# Patient Record
Sex: Male | Born: 1950 | Race: Black or African American | Hispanic: No | Marital: Single | Smoking: Current every day smoker
Health system: Southern US, Community
[De-identification: ages and names within clinical notes are randomized; demographics above are authoritative.]

## PROBLEM LIST (undated history)

## (undated) DIAGNOSIS — Z720 Tobacco use: Secondary | ICD-10-CM

## (undated) DIAGNOSIS — Z789 Other specified health status: Secondary | ICD-10-CM

## (undated) DIAGNOSIS — K219 Gastro-esophageal reflux disease without esophagitis: Secondary | ICD-10-CM

## (undated) DIAGNOSIS — D649 Anemia, unspecified: Secondary | ICD-10-CM

## (undated) DIAGNOSIS — I428 Other cardiomyopathies: Secondary | ICD-10-CM

## (undated) DIAGNOSIS — J449 Chronic obstructive pulmonary disease, unspecified: Secondary | ICD-10-CM

## (undated) DIAGNOSIS — F1490 Cocaine use, unspecified, uncomplicated: Secondary | ICD-10-CM

## (undated) DIAGNOSIS — F1090 Alcohol use, unspecified, uncomplicated: Secondary | ICD-10-CM

## (undated) HISTORY — DX: Other specified health status: Z78.9

## (undated) HISTORY — DX: Tobacco use: Z72.0

## (undated) HISTORY — DX: Other cardiomyopathies: I42.8

## (undated) HISTORY — DX: Alcohol use, unspecified, uncomplicated: F10.90

## (undated) HISTORY — DX: Cocaine use, unspecified, uncomplicated: F14.90

## (undated) HISTORY — DX: Chronic obstructive pulmonary disease, unspecified: J44.9

## (undated) HISTORY — DX: Anemia, unspecified: D64.9

---

## 1985-11-29 HISTORY — PX: WRIST ARTHROSCOPY: SHX838

## 2011-11-30 HISTORY — PX: MANDIBLE FRACTURE SURGERY: SHX706

## 2017-08-01 ENCOUNTER — Emergency Department (HOSPITAL_COMMUNITY): Payer: Medicaid Other

## 2017-08-01 ENCOUNTER — Encounter (HOSPITAL_COMMUNITY): Payer: Self-pay

## 2017-08-01 ENCOUNTER — Emergency Department (HOSPITAL_COMMUNITY)
Admission: EM | Admit: 2017-08-01 | Discharge: 2017-08-01 | Disposition: A | Discharge status: Home / Self Care | Payer: Medicaid Other | Attending: Emergency Medicine | Admitting: Emergency Medicine

## 2017-08-01 DIAGNOSIS — Y939 Activity, unspecified: Secondary | ICD-10-CM | POA: Diagnosis not present

## 2017-08-01 DIAGNOSIS — S41102A Unspecified open wound of left upper arm, initial encounter: Secondary | ICD-10-CM

## 2017-08-01 DIAGNOSIS — F17200 Nicotine dependence, unspecified, uncomplicated: Secondary | ICD-10-CM | POA: Insufficient documentation

## 2017-08-01 DIAGNOSIS — Y999 Unspecified external cause status: Secondary | ICD-10-CM | POA: Diagnosis not present

## 2017-08-01 DIAGNOSIS — Z23 Encounter for immunization: Secondary | ICD-10-CM | POA: Insufficient documentation

## 2017-08-01 DIAGNOSIS — S51812A Laceration without foreign body of left forearm, initial encounter: Secondary | ICD-10-CM | POA: Diagnosis not present

## 2017-08-01 DIAGNOSIS — S61412A Laceration without foreign body of left hand, initial encounter: Secondary | ICD-10-CM | POA: Diagnosis not present

## 2017-08-01 DIAGNOSIS — Y929 Unspecified place or not applicable: Secondary | ICD-10-CM | POA: Insufficient documentation

## 2017-08-01 DIAGNOSIS — S59912A Unspecified injury of left forearm, initial encounter: Secondary | ICD-10-CM | POA: Diagnosis present

## 2017-08-01 DIAGNOSIS — S46902A Unspecified injury of unspecified muscle, fascia and tendon at shoulder and upper arm level, left arm, initial encounter: Secondary | ICD-10-CM

## 2017-08-01 LAB — CBC WITH DIFFERENTIAL/PLATELET
BASOS ABS: 0 10*3/uL (ref 0.0–0.1)
BASOS PCT: 1 %
EOS PCT: 5 %
Eosinophils Absolute: 0.2 10*3/uL (ref 0.0–0.7)
HCT: 36 % — ABNORMAL LOW (ref 39.0–52.0)
Hemoglobin: 12.3 g/dL — ABNORMAL LOW (ref 13.0–17.0)
Lymphocytes Relative: 42 %
Lymphs Abs: 1.7 10*3/uL (ref 0.7–4.0)
MCH: 33.5 pg (ref 26.0–34.0)
MCHC: 34.2 g/dL (ref 30.0–36.0)
MCV: 98.1 fL (ref 78.0–100.0)
MONO ABS: 0.4 10*3/uL (ref 0.1–1.0)
Monocytes Relative: 10 %
Neutro Abs: 1.7 10*3/uL (ref 1.7–7.7)
Neutrophils Relative %: 42 %
PLATELETS: 147 10*3/uL — AB (ref 150–400)
RBC: 3.67 MIL/uL — ABNORMAL LOW (ref 4.22–5.81)
RDW: 12.1 % (ref 11.5–15.5)
WBC: 4 10*3/uL (ref 4.0–10.5)

## 2017-08-01 LAB — BASIC METABOLIC PANEL
ANION GAP: 9 (ref 5–15)
BUN: 7 mg/dL (ref 6–20)
CALCIUM: 8.9 mg/dL (ref 8.9–10.3)
CO2: 18 mmol/L — ABNORMAL LOW (ref 22–32)
CREATININE: 1.12 mg/dL (ref 0.61–1.24)
Chloride: 111 mmol/L (ref 101–111)
GLUCOSE: 84 mg/dL (ref 65–99)
Potassium: 3.8 mmol/L (ref 3.5–5.1)
Sodium: 138 mmol/L (ref 135–145)

## 2017-08-01 MED ORDER — CEFAZOLIN SODIUM-DEXTROSE 1-4 GM/50ML-% IV SOLN
1.0000 g | Freq: Once | INTRAVENOUS | Status: AC
  Administered 2017-08-01: 1 g via INTRAVENOUS
  Filled 2017-08-01: qty 50

## 2017-08-01 MED ORDER — TETANUS-DIPHTH-ACELL PERTUSSIS 5-2.5-18.5 LF-MCG/0.5 IM SUSP
0.5000 mL | Freq: Once | INTRAMUSCULAR | Status: AC
  Administered 2017-08-01: 0.5 mL via INTRAMUSCULAR
  Filled 2017-08-01: qty 0.5

## 2017-08-01 MED ORDER — CEPHALEXIN 500 MG PO CAPS: 500.0000 mg | ORAL_CAPSULE | Freq: Four times a day (QID) | ORAL | 0 refills | Status: DC

## 2017-08-01 MED ORDER — LIDOCAINE-EPINEPHRINE (PF) 2 %-1:200000 IJ SOLN
20.0000 mL | Freq: Once | INTRAMUSCULAR | Status: AC
  Administered 2017-08-01: 20 mL
  Filled 2017-08-01: qty 20
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
## 2017-08-01 NOTE — ED Notes (Signed)
Dr. Rosalia Hammersay at bedside repairing lacs

## 2017-08-01 NOTE — Discharge Instructions (Signed)
Call Dr. Merrilee SeashoreKuzma's office tomorrow in the morning for follow-up as soon as possible.
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
## 2017-08-01 NOTE — ED Notes (Signed)
Ortho tech paged  

## 2017-08-01 NOTE — ED Notes (Signed)
Ortho tech at bedside evaluating pt.

## 2017-08-01 NOTE — Progress Notes (Signed)
Orthopedic Tech Progress Note Patient Details:  Xavier AmorCharles Martinez 1951-08-20 [id number]  Ortho Devices Type of Ortho Device: Short arm splint Ortho Device/Splint Location: applied short arm splint to pt left hand.  applied abd dressing and volar to top and bottom of wrist and forearm.   left arm Ortho Device/Splint Interventions: Application   Alvina ChouWilliams, Avianna Moynahan C 08/01/2017, 11:16 PM

## 2017-08-10 ENCOUNTER — Other Ambulatory Visit: Payer: Self-pay | Admitting: Orthopedic Surgery

## 2017-08-11 ENCOUNTER — Ambulatory Visit (HOSPITAL_BASED_OUTPATIENT_CLINIC_OR_DEPARTMENT_OTHER): Payer: Medicaid Other | Admitting: Anesthesiology

## 2017-08-11 ENCOUNTER — Ambulatory Visit (HOSPITAL_BASED_OUTPATIENT_CLINIC_OR_DEPARTMENT_OTHER)
Admission: RE | Admit: 2017-08-11 | Discharge: 2017-08-11 | Disposition: A | Discharge status: Home / Self Care | Payer: Medicaid Other | Source: Ambulatory Visit | Attending: Orthopedic Surgery | Admitting: Orthopedic Surgery

## 2017-08-11 ENCOUNTER — Encounter (HOSPITAL_BASED_OUTPATIENT_CLINIC_OR_DEPARTMENT_OTHER): Payer: Self-pay | Admitting: Anesthesiology

## 2017-08-11 ENCOUNTER — Encounter (HOSPITAL_BASED_OUTPATIENT_CLINIC_OR_DEPARTMENT_OTHER)
Admission: RE | Disposition: A | Discharge status: Home / Self Care | Payer: Self-pay | Source: Ambulatory Visit | Attending: Orthopedic Surgery

## 2017-08-11 DIAGNOSIS — F17200 Nicotine dependence, unspecified, uncomplicated: Secondary | ICD-10-CM | POA: Insufficient documentation

## 2017-08-11 DIAGNOSIS — S66321A Laceration of extensor muscle, fascia and tendon of left index finger at wrist and hand level, initial encounter: Secondary | ICD-10-CM | POA: Insufficient documentation

## 2017-08-11 DIAGNOSIS — S66325A Laceration of extensor muscle, fascia and tendon of left ring finger at wrist and hand level, initial encounter: Secondary | ICD-10-CM | POA: Diagnosis not present

## 2017-08-11 DIAGNOSIS — S56522A Laceration of other extensor muscle, fascia and tendon at forearm level, left arm, initial encounter: Secondary | ICD-10-CM | POA: Diagnosis not present

## 2017-08-11 DIAGNOSIS — X58XXXA Exposure to other specified factors, initial encounter: Secondary | ICD-10-CM | POA: Diagnosis not present

## 2017-08-11 HISTORY — DX: Gastro-esophageal reflux disease without esophagitis: K21.9

## 2017-08-11 HISTORY — PX: TENDON REPAIR: SHX5111

## 2017-08-11 HISTORY — PX: WOUND EXPLORATION: SHX6188

## 2017-08-11 SURGERY — TENDON REPAIR
Anesthesia: General | Site: Arm Lower | Laterality: Left

## 2017-08-11 MED ORDER — MIDAZOLAM HCL 2 MG/2ML IJ SOLN
1.0000 mg | INTRAMUSCULAR | Status: DC | PRN
  Administered 2017-08-11: 2 mg via INTRAVENOUS

## 2017-08-11 MED ORDER — FENTANYL CITRATE (PF) 100 MCG/2ML IJ SOLN
INTRAMUSCULAR | Status: AC
  Filled 2017-08-11: qty 2

## 2017-08-11 MED ORDER — ONDANSETRON HCL 4 MG/2ML IJ SOLN
INTRAMUSCULAR | Status: AC
  Filled 2017-08-11: qty 2

## 2017-08-11 MED ORDER — BUPIVACAINE-EPINEPHRINE (PF) 0.5% -1:200000 IJ SOLN
INTRAMUSCULAR | Status: DC | PRN
  Administered 2017-08-11: 30 mL via PERINEURAL

## 2017-08-11 MED ORDER — HYDROCODONE-ACETAMINOPHEN 5-325 MG PO TABS: ORAL_TABLET | ORAL | 0 refills | Status: DC

## 2017-08-11 MED ORDER — MIDAZOLAM HCL 2 MG/2ML IJ SOLN
INTRAMUSCULAR | Status: AC
  Filled 2017-08-11: qty 2

## 2017-08-11 MED ORDER — CHLORHEXIDINE GLUCONATE 4 % EX LIQD: 60.0000 mL | Freq: Once | CUTANEOUS | Status: DC

## 2017-08-11 MED ORDER — CEFAZOLIN SODIUM-DEXTROSE 2-4 GM/100ML-% IV SOLN
INTRAVENOUS | Status: AC
  Filled 2017-08-11: qty 100

## 2017-08-11 MED ORDER — LACTATED RINGERS IV SOLN
INTRAVENOUS | Status: DC
  Administered 2017-08-11: 13:00:00 via INTRAVENOUS

## 2017-08-11 MED ORDER — PROPOFOL 10 MG/ML IV BOLUS
INTRAVENOUS | Status: AC
  Filled 2017-08-11: qty 20

## 2017-08-11 MED ORDER — HYDROMORPHONE HCL 1 MG/ML IJ SOLN: 0.2500 mg | INTRAMUSCULAR | Status: DC | PRN

## 2017-08-11 MED ORDER — PROMETHAZINE HCL 25 MG/ML IJ SOLN: 6.2500 mg | INTRAMUSCULAR | Status: DC | PRN

## 2017-08-11 MED ORDER — CEFAZOLIN SODIUM-DEXTROSE 2-4 GM/100ML-% IV SOLN
2.0000 g | INTRAVENOUS | Status: AC
  Administered 2017-08-11: 2 g via INTRAVENOUS

## 2017-08-11 MED ORDER — DEXAMETHASONE SODIUM PHOSPHATE 10 MG/ML IJ SOLN
INTRAMUSCULAR | Status: DC | PRN
  Administered 2017-08-11: 10 mg via INTRAVENOUS

## 2017-08-11 MED ORDER — SCOPOLAMINE 1 MG/3DAYS TD PT72: 1.0000 | MEDICATED_PATCH | Freq: Once | TRANSDERMAL | Status: DC | PRN

## 2017-08-11 MED ORDER — ONDANSETRON HCL 4 MG/2ML IJ SOLN
INTRAMUSCULAR | Status: DC | PRN
  Administered 2017-08-11: 4 mg via INTRAVENOUS

## 2017-08-11 MED ORDER — FENTANYL CITRATE (PF) 100 MCG/2ML IJ SOLN
50.0000 ug | INTRAMUSCULAR | Status: AC | PRN
  Administered 2017-08-11 (×2): 25 ug via INTRAVENOUS
  Administered 2017-08-11: 100 ug via INTRAVENOUS

## 2017-08-11 MED ORDER — DEXAMETHASONE SODIUM PHOSPHATE 10 MG/ML IJ SOLN
INTRAMUSCULAR | Status: AC
  Filled 2017-08-11: qty 1

## 2017-08-11 SURGICAL SUPPLY — 48 items
BANDAGE ACE 3X5.8 VEL STRL LF (GAUZE/BANDAGES/DRESSINGS) ×3 IMPLANT
BLADE SURG 15 STRL LF DISP TIS (BLADE) ×2 IMPLANT
BLADE SURG 15 STRL SS (BLADE) ×4
BNDG ESMARK 4X9 LF (GAUZE/BANDAGES/DRESSINGS) ×3 IMPLANT
BNDG GAUZE ELAST 4 BULKY (GAUZE/BANDAGES/DRESSINGS) ×3 IMPLANT
CHLORAPREP W/TINT 26ML (MISCELLANEOUS) ×3 IMPLANT
CORD BIPOLAR FORCEPS 12FT (ELECTRODE) ×3 IMPLANT
COVER BACK TABLE 60X90IN (DRAPES) ×3 IMPLANT
COVER MAYO STAND STRL (DRAPES) ×3 IMPLANT
CUFF TOURNIQUET SINGLE 18IN (TOURNIQUET CUFF) ×3 IMPLANT
DECANTER SPIKE VIAL GLASS SM (MISCELLANEOUS) ×3 IMPLANT
DRAPE EXTREMITY T 121X128X90 (DRAPE) ×3 IMPLANT
DRAPE SURG 17X23 STRL (DRAPES) ×3 IMPLANT
GAUZE SPONGE 4X4 12PLY STRL (GAUZE/BANDAGES/DRESSINGS) ×3 IMPLANT
GAUZE XEROFORM 1X8 LF (GAUZE/BANDAGES/DRESSINGS) ×3 IMPLANT
GLOVE BIO SURGEON STRL SZ7.5 (GLOVE) ×3 IMPLANT
GLOVE BIOGEL PI IND STRL 7.0 (GLOVE) ×1 IMPLANT
GLOVE BIOGEL PI IND STRL 8 (GLOVE) ×1 IMPLANT
GLOVE BIOGEL PI IND STRL 8.5 (GLOVE) ×1 IMPLANT
GLOVE BIOGEL PI INDICATOR 7.0 (GLOVE) ×2
GLOVE BIOGEL PI INDICATOR 8 (GLOVE) ×2
GLOVE BIOGEL PI INDICATOR 8.5 (GLOVE) ×2
GLOVE SURG ORTHO 8.0 STRL STRW (GLOVE) ×3 IMPLANT
GLOVE SURG SS PI 7.0 STRL IVOR (GLOVE) ×3 IMPLANT
GOWN STRL REUS W/ TWL LRG LVL3 (GOWN DISPOSABLE) ×1 IMPLANT
GOWN STRL REUS W/TWL LRG LVL3 (GOWN DISPOSABLE) ×2
GOWN STRL REUS W/TWL XL LVL3 (GOWN DISPOSABLE) ×6 IMPLANT
NS IRRIG 1000ML POUR BTL (IV SOLUTION) ×3 IMPLANT
PACK BASIN DAY SURGERY FS (CUSTOM PROCEDURE TRAY) ×3 IMPLANT
PAD CAST 3X4 CTTN HI CHSV (CAST SUPPLIES) ×1 IMPLANT
PADDING CAST ABS 4INX4YD NS (CAST SUPPLIES) ×2
PADDING CAST ABS COTTON 4X4 ST (CAST SUPPLIES) ×1 IMPLANT
PADDING CAST COTTON 3X4 STRL (CAST SUPPLIES) ×2
SLEEVE SCD COMPRESS KNEE MED (MISCELLANEOUS) ×3 IMPLANT
SLING ARM FOAM STRAP LRG (SOFTGOODS) ×3 IMPLANT
SPEAR EYE SURG WECK-CEL (MISCELLANEOUS) ×3 IMPLANT
SPLINT PLASTER CAST XFAST 3X15 (CAST SUPPLIES) ×20 IMPLANT
SPLINT PLASTER XTRA FASTSET 3X (CAST SUPPLIES) ×40
STOCKINETTE 4X48 STRL (DRAPES) ×3 IMPLANT
SUT ETHILON 3 0 PS 1 (SUTURE) ×6 IMPLANT
SUT ETHILON 4 0 PS 2 18 (SUTURE) ×6 IMPLANT
SUT FIBERWIRE 3-0 18 TAPR NDL (SUTURE) ×3
SUT VIC AB 3-0 PS1 18 (SUTURE) ×2
SUT VIC AB 3-0 PS1 18XBRD (SUTURE) ×1 IMPLANT
SUTURE FIBERWR 3-0 18 TAPR NDL (SUTURE) ×1 IMPLANT
SYR BULB 3OZ (MISCELLANEOUS) ×3 IMPLANT
TOWEL OR 17X24 6PK STRL BLUE (TOWEL DISPOSABLE) ×6 IMPLANT
UNDERPAD 30X30 (UNDERPADS AND DIAPERS) ×3 IMPLANT

## 2017-08-11 NOTE — Anesthesia Preprocedure Evaluation (Addendum)
Anesthesia Evaluation  Patient identified by MRN, date of birth, ID band Patient awake    Reviewed: Allergy & Precautions, NPO status , Patient's Chart, lab work & pertinent test results  History of Anesthesia Complications Negative for: history of anesthetic complications  Airway Mallampati: I  TM Distance: >3 FB Neck ROM: Full    Dental  (+) Dental Advisory Given, Edentulous Upper, Edentulous Lower   Pulmonary Current Smoker,    Pulmonary exam normal        Cardiovascular negative cardio ROS Normal cardiovascular exam     Neuro/Psych negative neurological ROS     GI/Hepatic negative GI ROS, Neg liver ROS,   Endo/Other  negative endocrine ROS  Renal/GU negative Renal ROS     Musculoskeletal negative musculoskeletal ROS (+)   Abdominal   Peds  Hematology negative hematology ROS (+)   Anesthesia Other Findings Day of surgery medications reviewed with the patient.  Reproductive/Obstetrics                            Anesthesia Physical Anesthesia Plan  ASA: II  Anesthesia Plan: General   Post-op Pain Management:    Induction: Intravenous  PONV Risk Score and Plan: 2 and Ondansetron and Dexamethasone  Airway Management Planned: LMA  Additional Equipment:   Intra-op Plan:   Post-operative Plan: Extubation in OR  Informed Consent: I have reviewed the patients History and Physical, chart, labs and discussed the procedure including the risks, benefits and alternatives for the proposed anesthesia with the patient or authorized representative who has indicated his/her understanding and acceptance.   Dental advisory given  Plan Discussed with: CRNA, Anesthesiologist and Surgeon  Anesthesia Plan Comments:        Anesthesia Quick Evaluation

## 2017-08-11 NOTE — Discharge Instructions (Addendum)
[facility] Instructions Hand Surgery  Wound Care: Keep your hand elevated above the level of your heart.  Do not allow it to dangle by your side.  Keep the dressing dry and do not remove it unless your doctor advises you to do so.  He will usually change it at the time of your post-op visit.  Moving your fingers is advised to stimulate circulation but will depend on the site of your surgery.  If you have a splint applied, your doctor will advise you regarding movement.  Activity: Do not drive or operate machinery today.  Rest today and then you may return to your normal activity and work as indicated by your physician.  Diet:  Drink liquids today or eat a light diet.  You may resume a regular diet tomorrow.     Post Anesthesia Home Care Instructions  Activity: Get plenty of rest for the remainder of the day. A responsible individual must stay with you for 24 hours following the procedure.  For the next 24 hours, DO NOT: -Drive a car -Operate machinery -Drink alcoholic beverages -Take any medication unless instructed by your physician -Make any legal decisions or sign important papers.  Meals: Start with liquid foods such as gelatin or soup. Progress to regular foods as tolerated. Avoid greasy, spicy, heavy foods. If nausea and/or vomiting occur, drink only clear liquids until the nausea and/or vomiting subsides. Call your physician if vomiting continues.  Special Instructions/Symptoms: Your throat may feel dry or sore from the anesthesia or the breathing tube placed in your throat during surgery. If this causes discomfort, gargle with warm salt water. The discomfort should disappear within 24 hours.  If you had a scopolamine patch placed behind your ear for the management of post- operative nausea and/or vomiting:  1. The medication in the patch is effective for 72 hours, after which it should be removed.  Wrap patch in a tissue and discard in the trash. Wash hands thoroughly with  soap and water. 2. You may remove the patch earlier than 72 hours if you experience unpleasant side effects which may include dry mouth, dizziness or visual disturbances. 3. Avoid touching the patch. Wash your hands with soap and water after contact with the patch.    Regional Anesthesia Blocks  1. Numbness or the inability to move the "blocked" extremity may last from 3-48 hours after placement. The length of time depends on the medication injected and your individual response to the medication. If the numbness is not going away after 48 hours, call your surgeon.  2. The extremity that is blocked will need to be protected until the numbness is gone and the  Strength has returned. Because you cannot feel it, you will need to take extra care to avoid injury. Because it may be weak, you may have difficulty moving it or using it. You may not know what position it is in without looking at it while the block is in effect.  3. For blocks in the legs and feet, returning to weight bearing and walking needs to be done carefully. You will need to wait until the numbness is entirely gone and the strength has returned. You should be able to move your leg and foot normally before you try and bear weight or walk. You will need someone to be with you when you first try to ensure you do not fall and possibly risk injury.  4. Bruising and tenderness at the needle site are common side effects and   will resolve in a few days.  5. Persistent numbness or new problems with movement should be communicated to the surgeon or the [facility] ([phone])/ [facility] ([phone]).  General expectations: Pain for two to three days. Fingers may become slightly swollen.  Call your doctor if any of the following occur: Severe pain not relieved by pain medication. Elevated temperature. Dressing soaked with blood. Inability to move fingers. White or bluish color to fingers.  

## 2017-08-11 NOTE — Progress Notes (Signed)
Assisted Dr. Singer with left, ultrasound guided, supraclavicular block. Side rails up, monitors on throughout procedure. See vital signs in flow sheet. Tolerated Procedure well. 

## 2017-08-11 NOTE — Brief Op Note (Signed)
08/11/2017  2:21 PM  PATIENT:  Schuyler Amorharles Mcdevitt  66 y.o. male  PRE-OPERATIVE DIAGNOSIS:  LEFT ARM/HAND LACERATIONS WITH EXTENSOR TENDON LACERATIONS  POST-OPERATIVE DIAGNOSIS:  LEFT ARM/HAND LACERATIONS WITH EXTENSOR TENDON LACERATIONS  PROCEDURE:  Procedure(s): TENDON REPAIR WITH GRAFT AND TENDON TRANSFER (Left) LEFT ARM WOUND EXPLORATION (Left)  SURGEON:  Surgeon(s) and Role:    * Betha LoaKuzma, Reena Borromeo, MD - Primary    * Cindee SaltKuzma, Gary, MD - Assisting  PHYSICIAN ASSISTANT:   ASSISTANTS: Cindee SaltGary Mitsuo Budnick, MD   ANESTHESIA:   regional and general  EBL:  Total I/O In: 500 [I.V.:500] Out: -   BLOOD ADMINISTERED:none  DRAINS: none   LOCAL MEDICATIONS USED:  NONE  SPECIMEN:  No Specimen  DISPOSITION OF SPECIMEN:  N/A  COUNTS:  YES  TOURNIQUET:   Total Tourniquet Time Documented: Upper Arm (Left) - 54 minutes Total: Upper Arm (Left) - 54 minutes   DICTATION: .Other Dictation: Dictation Number [id number]  PLAN OF CARE: Discharge to home after PACU  PATIENT DISPOSITION:  PACU - hemodynamically stable.

## 2017-08-11 NOTE — Op Note (Signed)
I assisted Surgeon(s) and Role:    * Betha LoaKuzma, Kevin, MD - Primary    Cindee Salt* Nels Munn, MD - Assisting on the Procedure(s): TENDON REPAIR WITH GRAFT AND TENDON TRANSFER LEFT ARM WOUND EXPLORATION on 08/11/2017.  I provided assistance on this case as follows: setup, approach, debridement identification of the lacerations, harvesting of the graft, repairs of the tendons including graft placement, closure of the wounds and application of the dressings and splint. I was present for the entire case.  Electronically signed by: Nicki ReaperKUZMA,Nitika Jackowski R, MD Date: 08/11/2017 Time: 2:23 PM

## 2017-08-11 NOTE — Transfer of Care (Signed)
Immediate Anesthesia Transfer of Care Note  Patient: Xavier Martinez  Procedure(s) Performed: Procedure(s): TENDON REPAIR WITH GRAFT AND TENDON TRANSFER (Left) LEFT ARM WOUND EXPLORATION (Left)  Patient Location: PACU  Anesthesia Type:GA combined with regional for post-op pain  Level of Consciousness: sedated  Airway & Oxygen Therapy: Patient Spontanous Breathing and Patient connected to face mask oxygen  Post-op Assessment: Report given to RN and Post -op Vital signs reviewed and stable  Post vital signs: Reviewed and stable  Last Vitals:  Vitals:   08/11/17 1252 08/11/17 1255  BP:  107/60  Pulse: 61 63  Resp: 14 15  Temp:    SpO2: 100% 100%    Last Pain:  Vitals:   08/11/17 1234  TempSrc: Oral         Complications: No apparent anesthesia complications

## 2017-08-11 NOTE — Anesthesia Procedure Notes (Signed)
Anesthesia Regional Block: Supraclavicular block   Pre-Anesthetic Checklist: ,, timeout performed, Correct Patient, Correct Site, Correct Laterality, Correct Procedure, Correct Position, site marked, Risks and benefits discussed,  Surgical consent,  Pre-op evaluation,  At surgeon's request and post-op pain management  Laterality: Left  Prep: chloraprep       Needles:  Injection technique: Single-shot  Needle Type: Echogenic Stimulator Needle     Needle Length: 5cm  Needle Gauge: 22     Additional Needles:   Narrative:  Start time: 08/11/2017 12:47 PM End time: 08/11/2017 12:57 PM Injection made incrementally with aspirations every 5 mL.  Performed by: Personally  Anesthesiologist: Heather RobertsSINGER, Lenka Zhao  Additional Notes: Functioning IV was confirmed and monitors applied.  A 50mm 22ga echogenic arrow stimulator was used. Sterile prep and drape,hand hygiene and sterile gloves were used.Ultrasound guidance: relevant anatomy identified, needle position confirmed, local anesthetic spread visualized around nerve(s)., vascular puncture avoided.  Image printed for medical record.  Negative aspiration and negative test dose prior to incremental administration of local anesthetic. The patient tolerated the procedure well.

## 2017-08-11 NOTE — Op Note (Signed)
[id number] 

## 2017-08-11 NOTE — Op Note (Signed)
Xavier Martinez, Xavier Martinez NO.:  [id number]  MEDICAL RECORD NO.:  [id number]  LOCATION:                                 FACILITY:  PHYSICIAN:  Betha Loa, MD             DATE OF BIRTH:  DATE OF PROCEDURE:  08/11/2017 DATE OF DISCHARGE:                              OPERATIVE REPORT   PREOPERATIVE DIAGNOSIS:  Left hand and forearm lacerations with extensor tendon lacerations.  POSTOPERATIVE DIAGNOSIS:  Left hand and forearm lacerations with laceration of EIP and EDC to index finger and partial laceration of EDC to ring finger.  PROCEDURE:   1. Left forearm and hand exploration of wounds 2. Repair hand intermediate laceration ~3cm 3. Repair forearm intermediate laceration ~4cm  4. Transfer of EDC to ring finger to the adjacent long finger EDC 5. Transfer of the EDC of index finger to long finger EDC 6. Repair of EIP with free tendon graft from EDC of index finger.  SURGEON:  Betha Loa, MD.  ASSISTANT:  None.  ANESTHESIA:  General with regional.  IV FLUIDS:  Per anesthesia flow sheet.  ESTIMATED BLOOD LOSS:  Minimal.  COMPLICATIONS:  None.  SPECIMENS:  None.  TOURNIQUET TIME:  54 minutes.  DISPOSITION:  Stable to PACU.  INDICATIONS:  Xavier Martinez is a 66 year old male who 10 days ago was involved in an incident in which he sustained lacerations to the left forearm and hand.  He was seen in the emergency department where his wounds were cleansed and closed.  He followed up in the office.  He had inability to extend the index finger.  I recommended exploration of wounds with repair of tendon, artery, and nerve as necessary.  Risks, benefits, and alternatives of surgery were discussed including the risk of blood loss, infection, damage to nerves, vessels, tendons, ligaments, bone, failure of surgery, need for additional surgery, complications with wound healing, continued pain, continued loss of range of motion, and possible need for tendon transfer or  tendon grafting due to tendon retraction.  He voiced understanding of these risks and elected to proceed.  OPERATIVE COURSE:  After being identified preoperatively by myself, the patient and I agreed upon the procedure and site of procedure.  Surgical site was marked.  The risks, benefits, and alternatives of surgery were reviewed and he wished to proceed.  Surgical consent had been signed. He was given IV Ancef as preoperative antibiotic prophylaxis.  He was transferred to the operating room and placed on the operating room table in supine position with the left upper extremity on an arm board. General anesthesia was induced by anesthesiologist.  A regional block had been performed by Anesthesia in preoperative holding.  Left upper extremity was prepped and draped in a normal sterile orthopedic fashion. Surgical pause was performed between surgeons, Anesthesia, and operating room staff and all were in agreement as to the patient, procedure, and site of procedure.  Tourniquet at the proximal aspect of the extremity was inflated to 250 mmHg after exsanguination of the limb with an Esmarch bandage.  The proximal wound on the forearm was explored first. All sutures were removed.  There was  laceration through the fascia, but not into the muscle belly.  The wound on the dorsal ulnar aspect of the hand was opened and extended.  The EDC to the long, ring, and small fingers were visualized and were intact.  The EDC and EIP to the index finger were able to be visualized and were outside the zone of injury. They were intact in the dorsum of the hand.  The wound on the central portion of the distal forearm dorsally was extended both proximally and distally.  The EDC to the long and small fingers were intact.  There was partial laceration to the EDC to the ring finger.  There was complete laceration of the EDC to the index finger and EIP tendon.  The lacerated ends were not able to be brought  together without significant tension. It was felt that a primary repair was not viable option.  The wounds were all copiously irrigated with sterile saline.  They were debrided of hematoma from the tissues deep to the fascia using the pickups and Ray- Tec sponge.  The proximal wound and the wound on the dorsum of the hand were closed with nylon in a horizontal mattress fashion.  The EDC to the ring finger was sutured to the long finger EDC adjacent to it. There was still approximately 25% of this tendon intact.  The EDC to the index finger was [facility] to the long finger distally. The remaining stump of the EDC proximally was then harvested and used as a free tendon graft for the EIP tendon.  This was repaired in a Pulvertaft weave style.  All tendon repairs were performed with 3-0 FiberWire suture.  Once the tendon repairs had been performed, the wrist was placed through tenodesis and there was good cascade of the digits. The dorsal wound was then closed with a combination of 3 and 4-0 nylon suture in a horizontal mattress fashion.  The wounds were dressed with sterile Xeroform, 4 x 4's and wrapped with a Kerlix bandage.  A volar slab splint was placed with the wrist extended 30-40 degrees and the digits extended as well.  This was wrapped with Kerlix and Ace bandage. Tourniquet was deflated at 54 minutes.  Fingertips were pink with brisk capillary refill after deflation of tourniquet.  The operative drapes were broken down, and the patient was awakened from anesthesia safely. He was transferred back to stretcher and taken to PACU in stable condition.  I will see him back in the office in 1 week for postoperative followup.  I will give him Norco 5/325 one to two p.o. q.6 hours p.r.n. pain, dispensed #30.     Betha LoaKevin Blaike Vickers, MD     KK/MEDQ  D:  08/11/2017  T:  08/11/2017  Job:  [id number]

## 2017-08-11 NOTE — Anesthesia Postprocedure Evaluation (Signed)
Anesthesia Post Note  Patient: Xavier Martinez  Procedure(s) Performed: Procedure(s) (LRB): TENDON REPAIR WITH GRAFT AND TENDON TRANSFER (Left) LEFT ARM WOUND EXPLORATION (Left)     Patient location during evaluation: PACU Anesthesia Type: General Level of consciousness: sedated Pain management: pain level controlled Vital Signs Assessment: post-procedure vital signs reviewed and stable Respiratory status: spontaneous breathing and respiratory function stable Cardiovascular status: stable Postop Assessment: no apparent nausea or vomiting Anesthetic complications: no    Last Vitals:  Vitals:   08/11/17 1445 08/11/17 1500  BP: 133/83 133/90  Pulse: 72 68  Resp: 13 16  Temp:    SpO2: 100% 98%    Last Pain:  Vitals:   08/11/17 1500  TempSrc:   PainSc: 0-No pain                 Leanza Shepperson DANIEL

## 2017-08-11 NOTE — Anesthesia Procedure Notes (Signed)
Procedure Name: LMA Insertion Date/Time: 08/11/2017 1:10 PM Performed by: Burna CashONRAD, Zhanae Proffit C Pre-anesthesia Checklist: Patient identified, Emergency Drugs available, Suction available and Patient being monitored Patient Re-evaluated:Patient Re-evaluated prior to induction Oxygen Delivery Method: Circle system utilized Preoxygenation: Pre-oxygenation with 100% oxygen Induction Type: IV induction Ventilation: Mask ventilation without difficulty LMA: LMA inserted LMA Size: 5.0 Number of attempts: 1 Airway Equipment and Method: Bite block Placement Confirmation: positive ETCO2 Tube secured with: Tape Dental Injury: Teeth and Oropharynx as per pre-operative assessment

## 2017-08-11 NOTE — H&P (Signed)
  Xavier Martinez is an 66 y.o. male.   Chief Complaint: left forearm/hand lacerations HPI: 66 yo male states he sustained lacerations to left hand/forearm 10 days ago.  Seen in ED where wounds cleaned and sutured.  Unable to extend left index finger.  He wishes to undergo operative exploration with repair of tendon as necessary.  Allergies: No Known Allergies  No past medical history on file.  No past surgical history on file.  Family History: No family history on file.  Social History:   reports that he has been smoking.  He has never used smokeless tobacco. He reports that he drinks alcohol. He reports that he does not use drugs.  Medications: No prescriptions prior to admission.    No results found for this or any previous visit (from the past 48 hour(s)).  No results found.   A comprehensive review of systems was negative.  There were no vitals taken for this visit.  General appearance: alert, cooperative and appears stated age Head: Normocephalic, without obvious abnormality, atraumatic Neck: supple, symmetrical, trachea midline Resp: clear to auscultation bilaterally Cardio: regular rate and rhythm GI: non-tender Extremities: Intact sensation and capillary refill all digits.  +epl/fpl/io.  Lacerations to dorsum of hand and forearm.  Unable to extend index finger. Pulses: 2+ and symmetric Skin: Skin color, texture, turgor normal. No rashes or lesions Neurologic: Grossly normal Incision/Wound: as above  Assessment/Plan Left forearm/hand lacerations.  Recommend exploration with repair of tendon/artery/nerve as necessary.  Discussed possible transfer of tendon to adjacent tendon or grafting if tendon has retracted too much for primary repair.  Risks, benefits, and alternatives of surgery were discussed and the patient agrees with the plan of care.   [name] R 08/11/2017, 8:42 AM

## 2017-08-12 ENCOUNTER — Encounter (HOSPITAL_BASED_OUTPATIENT_CLINIC_OR_DEPARTMENT_OTHER): Payer: Self-pay | Admitting: Orthopedic Surgery

## 2019-04-19 ENCOUNTER — Emergency Department (HOSPITAL_COMMUNITY): Payer: Medicare HMO

## 2019-04-19 ENCOUNTER — Encounter (HOSPITAL_COMMUNITY): Payer: Self-pay | Admitting: Emergency Medicine

## 2019-04-19 ENCOUNTER — Emergency Department (HOSPITAL_COMMUNITY)
Admission: EM | Admit: 2019-04-19 | Discharge: 2019-04-19 | Disposition: A | Discharge status: Home / Self Care | Payer: Medicare HMO | Attending: Emergency Medicine | Admitting: Emergency Medicine

## 2019-04-19 ENCOUNTER — Other Ambulatory Visit: Payer: Self-pay

## 2019-04-19 DIAGNOSIS — Y9383 Activity, rough housing and horseplay: Secondary | ICD-10-CM | POA: Insufficient documentation

## 2019-04-19 DIAGNOSIS — Y929 Unspecified place or not applicable: Secondary | ICD-10-CM | POA: Diagnosis not present

## 2019-04-19 DIAGNOSIS — W010XXA Fall on same level from slipping, tripping and stumbling without subsequent striking against object, initial encounter: Secondary | ICD-10-CM | POA: Insufficient documentation

## 2019-04-19 DIAGNOSIS — S6991XA Unspecified injury of right wrist, hand and finger(s), initial encounter: Secondary | ICD-10-CM | POA: Insufficient documentation

## 2019-04-19 DIAGNOSIS — S4991XA Unspecified injury of right shoulder and upper arm, initial encounter: Secondary | ICD-10-CM

## 2019-04-19 DIAGNOSIS — Y999 Unspecified external cause status: Secondary | ICD-10-CM | POA: Insufficient documentation

## 2019-04-19 DIAGNOSIS — F17210 Nicotine dependence, cigarettes, uncomplicated: Secondary | ICD-10-CM | POA: Diagnosis not present

## 2019-04-19 NOTE — Discharge Instructions (Signed)
You were seen in the emergency department with right wrist pain after fall.  I have applied a splint to help with pain symptoms.  Apply ice for the remainder of the morning and keep the extremity elevated.  You may take Tylenol for pain.  Call your PCP and the orthopedic doctor listed as you may require additional x-ray in the next 1 to 2 weeks.  Return to the emergency department with any fevers, worsening pain, other severe symptoms.

## 2019-04-19 NOTE — ED Provider Notes (Signed)
Emergency Department Provider Note   I have reviewed the triage vital signs and the nursing notes.   HISTORY  Chief Complaint Arm Injury   HPI Xavier Martinez is a 68 y.o. male presents to the emergency department for evaluation of right wrist pain.  Patient is right-hand dominant.  He states he was wrestling with his nephew yesterday when he fell backwards onto his outstretched hand.  He has had pain and some swelling in the wrist since yesterday.  He initially had some tingling in the fingers but this is resolved.  Denies pain in the elbow or shoulder.  He did not hit his head.  He has not had fevers, chills, warmth over the joint.  Pain is worse with movement.   Past Medical History:  Diagnosis Date  . GERD (gastroesophageal reflux disease)     There are no active problems to display for this patient.   Past Surgical History:  Procedure Laterality Date  . MANDIBLE FRACTURE SURGERY Bilateral 2013  . TENDON REPAIR Left 08/11/2017   Procedure: TENDON REPAIR WITH GRAFT AND TENDON TRANSFER;  Surgeon: Betha LoaKuzma, Kevin, MD;  Location: [facility];  Service: Orthopedics;  Laterality: Left;  . WOUND EXPLORATION Left 08/11/2017   Procedure: LEFT ARM WOUND EXPLORATION;  Surgeon: Betha LoaKuzma, Kevin, MD;  Location: [facility];  Service: Orthopedics;  Laterality: Left;  . WRIST ARTHROSCOPY Right 1987    Allergies Patient has no known allergies.  No family history on file.  Social History Social History   Tobacco Use  . Smoking status: Current Every Day Smoker    Packs/day: 0.50    Years: 25.00    Pack years: 12.50    Types: Cigarettes  . Smokeless tobacco: Never Used  Substance Use Topics  . Alcohol use: Yes  . Drug use: No    Review of Systems  Constitutional: No fever/chills Eyes: No visual changes. ENT: No sore throat. Cardiovascular: Denies chest pain. Respiratory: Denies shortness of breath. Gastrointestinal: No abdominal pain.  No nausea, no  vomiting.  No diarrhea.  No constipation. Genitourinary: Negative for dysuria. Musculoskeletal: Negative for back pain. Positive right wrist pain.  Skin: Negative for rash. Neurological: Negative for headaches, focal weakness or numbness.  10-point ROS otherwise negative.  ____________________________________________   PHYSICAL EXAM:  VITAL SIGNS: ED Triage Vitals  Enc Vitals Group     BP 04/19/19 0854 123/74     Pulse Rate 04/19/19 0854 89     Resp 04/19/19 0854 16     Temp 04/19/19 0854 98.5 F (36.9 C)     Temp Source 04/19/19 0854 Oral     SpO2 04/19/19 0855 98 %     Weight 04/19/19 0852 135 lb (61.2 kg)     Height 04/19/19 0852 5\' 9"  (1.753 m)     Pain Score 04/19/19 0852 8   Constitutional: Alert and oriented. Well appearing and in no acute distress. Eyes: Conjunctivae are normal. Head: Atraumatic. Nose: No congestion/rhinnorhea. Mouth/Throat: Mucous membranes are moist.  Neck: No stridor.   Cardiovascular: Normal rate, regular rhythm.    Respiratory: Normal respiratory effort.   Gastrointestinal: No distention.  Musculoskeletal: Swelling without erythema or warmth over the right wrist.  No cellulitis.  Limited range of motion.  No specific scaphoid tenderness.  Normal range of motion of the right shoulder, elbow, fingers.  Good capillary refill.  Neurologic:  Normal speech and language. No gross focal neurologic deficits are appreciated.  Skin:  Skin is warm, dry and  intact. No rash noted.  ____________________________________________  RADIOLOGY  Dg Wrist Complete Right  Result Date: 04/19/2019 CLINICAL DATA:  Right wrist pain after fall yesterday. EXAM: RIGHT WRIST - COMPLETE 3+ VIEW COMPARISON:  None. FINDINGS: There is no evidence of fracture or dislocation. Mild narrowing of radiocarpal joint is noted. Vascular calcifications are noted. 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 No acute abnormality is noted. Electronically  Signed   By: Lupita Raider M.D.   On: 04/19/2019 09:27    ____________________________________________   PROCEDURES  Procedure(s) performed:   Procedures  None  ____________________________________________   INITIAL IMPRESSION / ASSESSMENT AND PLAN / ED COURSE  Pertinent labs & imaging results that were available during my care of the patient were reviewed by me and considered in my medical decision making (see chart for details).   Patient presents to the emergency department for evaluation of right wrist pain after fall yesterday.  Plain film reviewed which shows degenerative disease but no acute fracture dislocation.  Plan for Velcro wrist splint and repeat x-ray in the next 2 weeks.  Provided contact information for the patient's PCP as well as local orthopedic follow-up.   ____________________________________________  FINAL CLINICAL IMPRESSION(S) / ED DIAGNOSES  Final diagnoses:  Injury of right upper extremity, initial encounter    Note:  This document was prepared using Dragon voice recognition software and may include unintentional dictation errors.  Alona Bene, MD Emergency Medicine    Glorianne Proctor, Arlyss Repress, MD 04/19/19 [phone]

## 2019-04-19 NOTE — ED Triage Notes (Signed)
Pt states that he was playing around with his nephew yesterday and hurt his right wrist and forearm

## 2020-02-14 ENCOUNTER — Ambulatory Visit: Payer: Medicare HMO

## 2020-02-15 ENCOUNTER — Ambulatory Visit: Payer: Medicare HMO

## 2020-03-23 IMAGING — CR RIGHT WRIST - COMPLETE 3+ VIEW
4 series · 4 of 4 positions shown · non-contrast
Comparison: None.

CLINICAL DATA: Right wrist pain after fall yesterday.

EXAM:
RIGHT WRIST - COMPLETE 3+ VIEW

[pa]
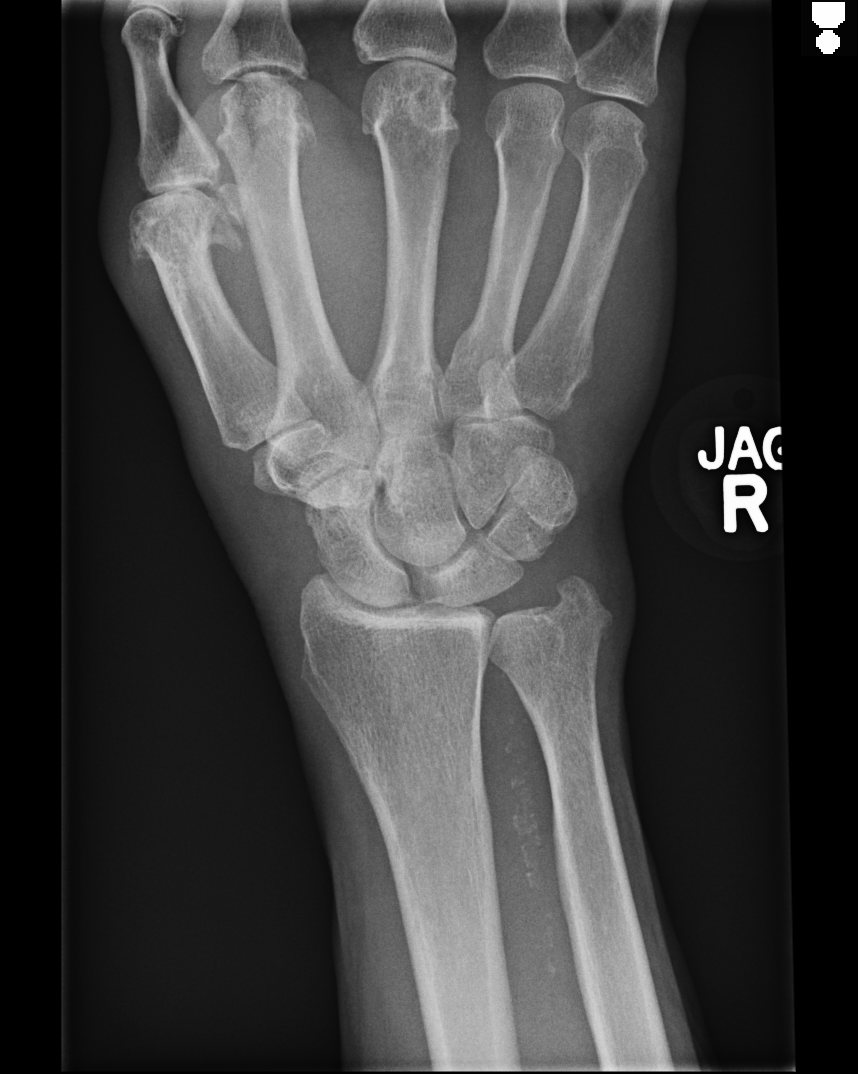

[oblique]
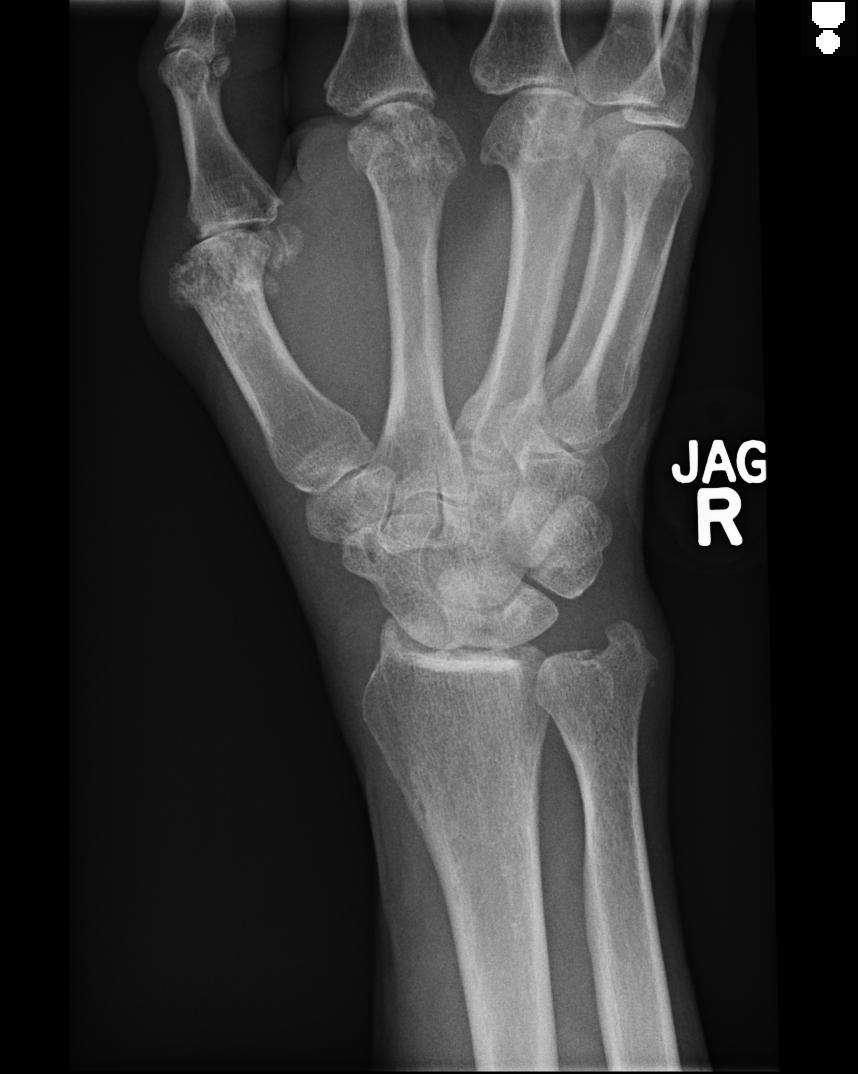

[lat]
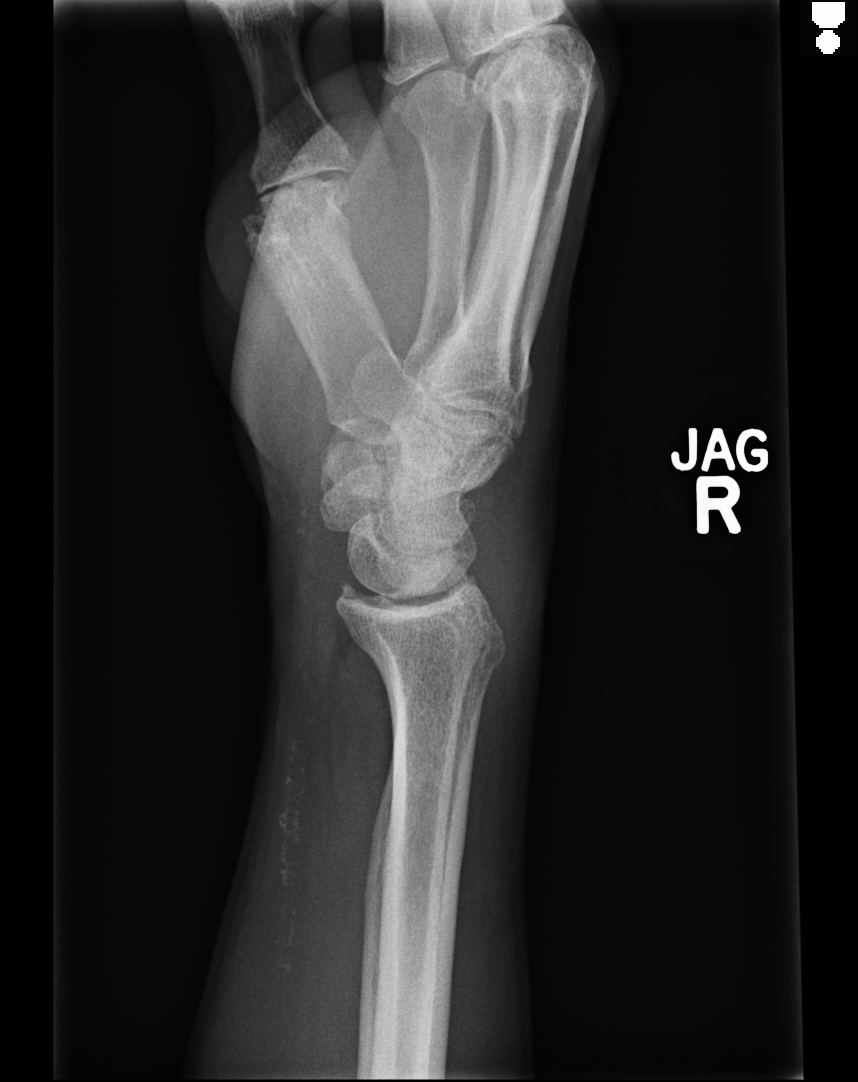

[navicul b.]
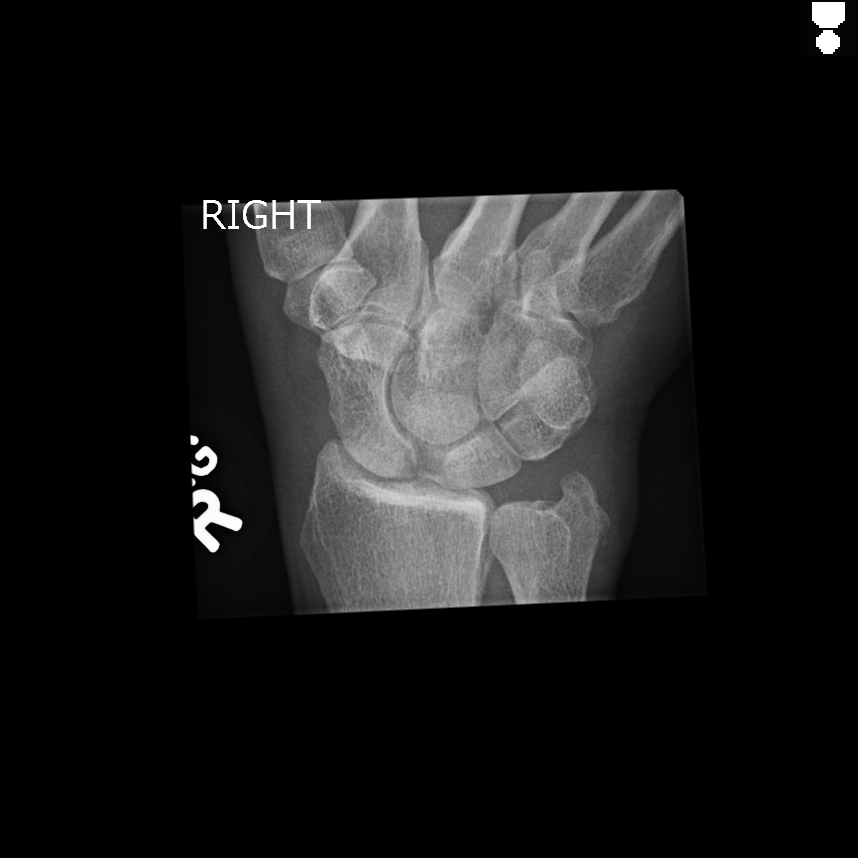

[4 of 4 positions shown; findings below may reference images not displayed]

FINDINGS: There is no evidence of fracture or dislocation. Mild narrowing of
radiocarpal joint is noted. Vascular calcifications are noted.
IMPRESSION: Mild degenerative joint disease of radiocarpal joint is noted. No
acute abnormality is noted.

## 2022-07-17 ENCOUNTER — Inpatient Hospital Stay: Admit: 2022-07-17 | Payer: Medicare HMO | Admitting: Internal Medicine

## 2022-07-17 ENCOUNTER — Encounter (HOSPITAL_COMMUNITY): Payer: Self-pay

## 2022-10-26 ENCOUNTER — Encounter: Payer: Self-pay | Admitting: Cardiology

## 2022-10-26 NOTE — Progress Notes (Signed)
Cardiology Office Note  Date: 10/27/2022   ID: Mindy Verzosa, DOB 1951-02-09, MRN [id number]  PCP:  Patient, No Pcp Per  Cardiologist:  Rozann Lesches, MD Electrophysiologist:  None   Chief Complaint  Patient presents with   Establish cardiology follow-up    History of Present Illness: Xavier Martinez is a 71 y.o. male referred for cardiology consultation by Ms. Rexene Agent with [facility] to establish follow-up of nonischemic cardiomyopathy.  I reviewed the available records.  He has a history of nonischemic cardiomyopathy based on testing through [facility] over the last year, most recent echocardiogram and Lexiscan Myoview are detailed below.  History also complicated by polysubstance abuse including cocaine, also GI bleeding with severe symptomatic anemia.  At this point he does not have a PCP, apparently was scheduled to see Dr. Huel Cote in [place], but missed his visit when he was in the hospital and has not seen anyone yet.  My understanding is that Dr. Huel Cote is leaving that practice, so I have asked him to call and see if he can get set up with another provider.  He describes NYHA class II dyspnea at this point, no angina or palpitations, no syncope.  States that he had a flu shot a few weeks ago and did have a "cold" after that.  Also reports trouble with COPD, has an albuterol inhaler.  In the past he had followed at [facility], but has not been seen in at least a few years.  He brought his medicines in today, states that he is taking them regularly.  Current cardiac regimen includes Coreg, Cozaar, and Lasix.  He is also on aspirin and Lipitor.  No recent lab work for review.  I personally reviewed his ECG which shows sinus rhythm with increased voltage and nonspecific T wave changes.  I asked him directly about cocaine use today, he states that he is still using it intermittently but is trying to stop.  This has been a long standing process for him.  He is also smoking cigarettes.  I  talked with him frankly about the critical importance of substance abuse cessation.  He does not report any obvious change in stools, no other bleeding.  Past Medical History:  Diagnosis Date   Alcohol use    Anemia    Cocaine use    COPD (chronic obstructive pulmonary disease) (HCC)    GERD (gastroesophageal reflux disease)    Nonischemic cardiomyopathy ([place])    Tobacco use     Past Surgical History:  Procedure Laterality Date   MANDIBLE FRACTURE SURGERY Bilateral 2013   TENDON REPAIR Left 08/11/2017   Procedure: TENDON REPAIR WITH GRAFT AND TENDON TRANSFER;  Surgeon: Leanora Cover, MD;  Location: [place];  Service: Orthopedics;  Laterality: Left;   WOUND EXPLORATION Left 08/11/2017   Procedure: LEFT ARM WOUND EXPLORATION;  Surgeon: Leanora Cover, MD;  Location: [place];  Service: Orthopedics;  Laterality: Left;   WRIST ARTHROSCOPY Right 1987    Current Outpatient Medications  Medication Sig Dispense Refill   albuterol (VENTOLIN HFA) 108 (90 Base) MCG/ACT inhaler Inhale 2 puffs into the lungs every 6 (six) hours as needed for wheezing.     aspirin EC 81 MG tablet Take 81 mg by mouth daily.     atorvastatin (LIPITOR) 80 MG tablet Take 80 mg by mouth daily.     carvedilol (COREG) 3.125 MG tablet Take 3.125 mg by mouth 2 (two) times daily with a meal.  ferrous sulfate 325 (65 FE) MG tablet Take 325 mg by mouth daily with breakfast.     furosemide (LASIX) 20 MG tablet Take 20 mg by mouth daily.     HYDROcodone-acetaminophen (NORCO) 5-325 MG tablet 1-2 tabs po q6 hours prn pain 30 tablet 0   nitroGLYCERIN (NITROSTAT) 0.4 MG SL tablet Place 0.4 mg under the tongue every 5 (five) minutes as needed for chest pain.     pantoprazole (PROTONIX) 40 MG tablet Take 40 mg by mouth daily.     losartan (COZAAR) 25 MG tablet Take 1 tablet (25 mg total) by mouth daily. 90 tablet 1   No current facility-administered medications for this visit.   Allergies:   Patient has no known allergies.   Social History: The patient  reports that he has been smoking cigarettes. He has a 12.50 pack-year smoking history. He has never used smokeless tobacco. He reports current alcohol use of about 10.0 standard drinks of alcohol per week. He reports current drug use. Drug: "Crack" cocaine.   Family History: The patient's family history includes Heart disease in his mother.   ROS: No syncope.  No leg swelling, no orthopnea or PND.  Physical Exam: VS:  BP 122/84   Pulse 75   Ht 5\' 9"  (1.753 m)   Wt 126 lb 6.4 oz (57.3 kg)   SpO2 95%   BMI 18.67 kg/m , BMI Body mass index is 18.67 kg/m.  Wt Readings from Last 3 Encounters:  10/27/22 126 lb 6.4 oz (57.3 kg)  04/19/19 135 lb (61.2 kg)  08/11/17 132 lb (59.9 kg)    General: Patient appears comfortable at rest. HEENT: Conjunctiva and lids normal. Neck: Supple, no elevated JVP or carotid bruits. Lungs: Clear to auscultation, nonlabored breathing at rest. Cardiac: Regular rate and rhythm, no S3 or significant systolic murmur. Abdomen: Soft, nontender, bowel sounds present. Extremities: No pitting edema, distal pulses 2+. Skin: Warm and dry. Musculoskeletal: No kyphosis. Neuropsychiatric: Alert and oriented x3, affect grossly appropriate.  ECG:   No old tracings available for direct review today.  Recent Labwork:  August 2023: Potassium 4.4, BUN 14, creatinine 1.23, hemoglobin 8.4, platelets 211, UDS positive for cocaine, NT-proBNP 2905, troponin T 154 and 141  Other Studies Reviewed Today:  Lexiscan Myoview 11/12/2021 ([facility]): 1. No evidence of inducible ischemia.  2. LVEF of 41% with global hypokinesis of the left ventricle.  3. The left ventricle is dilated.   Echocardiogram 07/19/2022 ([facility]): Left Ventricle: EF: 35-40%. Ejection fraction measured by 3D is 36%,    Left Atrium: Left atrium is moderately dilated at 4.700 cm.    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    Mitral Valve:  There is moderate to severe regurgitation.    Tricuspid Valve: There is mild to moderate regurgitation.   Assessment and Plan:  1.  HFrEF with nonischemic cardiomyopathy based on work-up through [facility].  LVEF 35 to 40% as of August of this year.  This is complicated by recurring cocaine abuse, I had a very frank discussion with him about the absolutely critical importance of cessation of all substance abuse.  He states that he is taking his medications regularly including Coreg, Cozaar, and Lasix.  Check BMET and follow-up echocardiogram.  Refill provided for Cozaar 25 mg daily at this point.  Office follow-up arranged.  Can address GDMT as tolerated going forward.  2.  History of GI bleed with severe symptomatic anemia based on chart review.  He does not report any stool  changes or obvious bleeding at this time.  His last hemoglobin was up to 8.4 during hospitalization through [facility] earlier in the year.  I have asked him to establish with PCP as this will clearly need follow-up over time.  2.  Health maintenance, I have asked him to reach out to Dr. Verna Czech practice to make sure that he has another provider scheduled for follow-up.  Also asked him to check with the [facility] which he had been following with in the past.  Medication Adjustments/Labs and Tests Ordered: Current medicines are reviewed at length with the patient today.  Concerns regarding medicines are outlined above.   Tests Ordered: Orders Placed This Encounter  Procedures   Basic metabolic panel   EKG 12-Lead   ECHOCARDIOGRAM COMPLETE    Medication Changes: Meds ordered this encounter  Medications   losartan (COZAAR) 25 MG tablet    Sig: Take 1 tablet (25 mg total) by mouth daily.    Dispense:  90 tablet    Refill:  1    Disposition:  Follow up  6 months in the [place] office.  Signed, Jonelle Sidle, MD, [facility] 10/27/2022 1:47 PM    [facility] [address]  [place], [place] [address] Phone: [phone]; Fax: [phone]

## 2022-10-27 ENCOUNTER — Encounter: Payer: Self-pay | Admitting: Cardiology

## 2022-10-27 ENCOUNTER — Ambulatory Visit: Payer: Medicare Other | Attending: Cardiology | Admitting: Cardiology

## 2022-10-27 VITALS — BP 122/84 | HR 75 | Ht 69.0 in | Wt 126.4 lb

## 2022-10-27 DIAGNOSIS — F1910 Other psychoactive substance abuse, uncomplicated: Secondary | ICD-10-CM

## 2022-10-27 DIAGNOSIS — I5020 Unspecified systolic (congestive) heart failure: Secondary | ICD-10-CM

## 2022-10-27 DIAGNOSIS — Z8719 Personal history of other diseases of the digestive system: Secondary | ICD-10-CM

## 2022-10-27 MED ORDER — LOSARTAN POTASSIUM 25 MG PO TABS: 25.0000 mg | ORAL_TABLET | Freq: Every day | ORAL | 1 refills | Status: DC

## 2022-10-27 NOTE — Patient Instructions (Addendum)
Medication Instructions:   Your physician recommends that you continue on your current medications as directed. Please refer to the Current Medication list given to you today.   Labwork:  BMET today   Testing/Procedures: Your physician has requested that you have an echocardiogram. Echocardiography is a painless test that uses sound waves to create images of your heart. It provides your doctor with information about the size and shape of your heart and how well your heart's chambers and valves are working. This procedure takes approximately one hour. There are no restrictions for this procedure. Please do NOT wear cologne, perfume, aftershave, or lotions (deodorant is allowed). Please arrive 15 minutes prior to your appointment time.   Follow-Up: 6 months in the [facility]  Any Other Special Instructions Will Be Listed Below (If Applicable).    [facility]: [address], [place], [place] [phone]   If you need a refill on your cardiac medications before your next appointment, please call your pharmacy.

## 2022-11-08 ENCOUNTER — Ambulatory Visit: Payer: Medicare Other | Attending: Cardiology

## 2022-11-08 DIAGNOSIS — I5020 Unspecified systolic (congestive) heart failure: Secondary | ICD-10-CM

## 2022-11-08 LAB — ECHOCARDIOGRAM COMPLETE
AR max vel: 2.41 cm2
AV Peak grad: 4.8 mmHg
Ao pk vel: 1.09 m/s
Area-P 1/2: 2.3 cm2
Calc EF: 55.9 %
MV M vel: 5.87 m/s
MV Peak grad: 137.8 mmHg
Radius: 0.38 cm
S' Lateral: 3.3 cm
Single Plane A2C EF: 55.5 %
Single Plane A4C EF: 55.1 %

## 2022-11-09 ENCOUNTER — Other Ambulatory Visit: Payer: Self-pay

## 2022-11-09 DIAGNOSIS — I340 Nonrheumatic mitral (valve) insufficiency: Secondary | ICD-10-CM

## 2022-11-16 ENCOUNTER — Telehealth: Payer: Self-pay | Admitting: Cardiology

## 2022-11-16 MED ORDER — PANTOPRAZOLE SODIUM 40 MG PO TBEC: 40.0000 mg | DELAYED_RELEASE_TABLET | Freq: Every day | ORAL | 3 refills | Status: DC

## 2022-11-16 MED ORDER — ASPIRIN 81 MG PO TBEC: 81.0000 mg | DELAYED_RELEASE_TABLET | Freq: Every day | ORAL | 3 refills | Status: DC

## 2022-11-16 MED ORDER — FUROSEMIDE 20 MG PO TABS: 20.0000 mg | ORAL_TABLET | Freq: Every day | ORAL | 3 refills | Status: DC

## 2022-11-16 MED ORDER — CARVEDILOL 3.125 MG PO TABS: 3.1250 mg | ORAL_TABLET | Freq: Two times a day (BID) | ORAL | 3 refills | Status: DC

## 2022-11-16 MED ORDER — ATORVASTATIN CALCIUM 80 MG PO TABS: 80.0000 mg | ORAL_TABLET | Freq: Every day | ORAL | 3 refills | Status: DC

## 2022-11-16 MED ORDER — LOSARTAN POTASSIUM 25 MG PO TABS: 25.0000 mg | ORAL_TABLET | Freq: Every day | ORAL | 1 refills | Status: DC

## 2022-11-16 NOTE — Telephone Encounter (Signed)
All medications have been refilled

## 2022-11-16 NOTE — Telephone Encounter (Signed)
*  STAT* If patient is at the pharmacy, call can be transferred to refill team.   1. Which medications need to be refilled? (please list name of each medication and dose if known)  losartan (COZAAR) 25 MG tablet aspirin EC 81 MG tablet furosemide (LASIX) 20 MG tablet atorvastatin (LIPITOR) 80 MG tablet pantoprazole (PROTONIX) 40 MG tablet carvedilol (COREG) 3.125 MG tablet  2. Which pharmacy/location (including street and city if local pharmacy) is medication to be sent to? [facility] - [address] [place], [place] [address]  3. Do they need a 30 day or 90 day supply?  90 day supply  Patient states he will run out of all medications tomorrow.

## 2022-12-30 DIAGNOSIS — J441 Chronic obstructive pulmonary disease with (acute) exacerbation: Secondary | ICD-10-CM | POA: Diagnosis not present

## 2022-12-30 DIAGNOSIS — R059 Cough, unspecified: Secondary | ICD-10-CM | POA: Diagnosis not present

## 2022-12-30 DIAGNOSIS — F17210 Nicotine dependence, cigarettes, uncomplicated: Secondary | ICD-10-CM | POA: Diagnosis not present

## 2022-12-30 DIAGNOSIS — Z7982 Long term (current) use of aspirin: Secondary | ICD-10-CM | POA: Diagnosis not present

## 2022-12-30 DIAGNOSIS — R062 Wheezing: Secondary | ICD-10-CM | POA: Diagnosis not present

## 2022-12-30 DIAGNOSIS — Z720 Tobacco use: Secondary | ICD-10-CM | POA: Diagnosis not present

## 2022-12-30 DIAGNOSIS — R0902 Hypoxemia: Secondary | ICD-10-CM | POA: Diagnosis not present

## 2022-12-30 DIAGNOSIS — R0602 Shortness of breath: Secondary | ICD-10-CM | POA: Diagnosis not present

## 2022-12-30 DIAGNOSIS — Z743 Need for continuous supervision: Secondary | ICD-10-CM | POA: Diagnosis not present

## 2022-12-30 DIAGNOSIS — R6889 Other general symptoms and signs: Secondary | ICD-10-CM | POA: Diagnosis not present

## 2023-04-21 DIAGNOSIS — Z8719 Personal history of other diseases of the digestive system: Secondary | ICD-10-CM | POA: Diagnosis not present

## 2023-04-21 DIAGNOSIS — J90 Pleural effusion, not elsewhere classified: Secondary | ICD-10-CM | POA: Diagnosis not present

## 2023-04-21 DIAGNOSIS — D649 Anemia, unspecified: Secondary | ICD-10-CM | POA: Diagnosis not present

## 2023-04-21 DIAGNOSIS — R7989 Other specified abnormal findings of blood chemistry: Secondary | ICD-10-CM | POA: Diagnosis not present

## 2023-04-21 DIAGNOSIS — Z79899 Other long term (current) drug therapy: Secondary | ICD-10-CM | POA: Diagnosis not present

## 2023-04-21 DIAGNOSIS — I509 Heart failure, unspecified: Secondary | ICD-10-CM | POA: Diagnosis not present

## 2023-04-21 DIAGNOSIS — R531 Weakness: Secondary | ICD-10-CM | POA: Diagnosis not present

## 2023-04-21 DIAGNOSIS — R10826 Epigastric rebound abdominal tenderness: Secondary | ICD-10-CM | POA: Diagnosis not present

## 2023-04-21 DIAGNOSIS — J449 Chronic obstructive pulmonary disease, unspecified: Secondary | ICD-10-CM | POA: Diagnosis not present

## 2023-04-21 DIAGNOSIS — R0602 Shortness of breath: Secondary | ICD-10-CM | POA: Diagnosis not present

## 2023-04-21 DIAGNOSIS — J9811 Atelectasis: Secondary | ICD-10-CM | POA: Diagnosis not present

## 2023-04-21 DIAGNOSIS — I517 Cardiomegaly: Secondary | ICD-10-CM | POA: Diagnosis not present

## 2023-04-21 DIAGNOSIS — F17210 Nicotine dependence, cigarettes, uncomplicated: Secondary | ICD-10-CM | POA: Diagnosis not present

## 2023-04-22 DIAGNOSIS — Z79899 Other long term (current) drug therapy: Secondary | ICD-10-CM | POA: Diagnosis not present

## 2023-04-22 DIAGNOSIS — F17210 Nicotine dependence, cigarettes, uncomplicated: Secondary | ICD-10-CM | POA: Diagnosis not present

## 2023-04-22 DIAGNOSIS — D62 Acute posthemorrhagic anemia: Secondary | ICD-10-CM | POA: Diagnosis not present

## 2023-04-22 DIAGNOSIS — R9431 Abnormal electrocardiogram [ECG] [EKG]: Secondary | ICD-10-CM | POA: Diagnosis not present

## 2023-04-22 DIAGNOSIS — I517 Cardiomegaly: Secondary | ICD-10-CM | POA: Diagnosis not present

## 2023-04-22 DIAGNOSIS — K219 Gastro-esophageal reflux disease without esophagitis: Secondary | ICD-10-CM | POA: Diagnosis not present

## 2023-04-22 DIAGNOSIS — I10 Essential (primary) hypertension: Secondary | ICD-10-CM | POA: Diagnosis not present

## 2023-04-22 DIAGNOSIS — Z789 Other specified health status: Secondary | ICD-10-CM | POA: Diagnosis not present

## 2023-04-22 DIAGNOSIS — D649 Anemia, unspecified: Secondary | ICD-10-CM | POA: Diagnosis not present

## 2023-04-22 DIAGNOSIS — R7989 Other specified abnormal findings of blood chemistry: Secondary | ICD-10-CM | POA: Diagnosis not present

## 2023-04-22 DIAGNOSIS — I5022 Chronic systolic (congestive) heart failure: Secondary | ICD-10-CM | POA: Diagnosis not present

## 2023-04-22 DIAGNOSIS — Z8719 Personal history of other diseases of the digestive system: Secondary | ICD-10-CM | POA: Diagnosis not present

## 2023-04-22 DIAGNOSIS — D500 Iron deficiency anemia secondary to blood loss (chronic): Secondary | ICD-10-CM | POA: Diagnosis not present

## 2023-04-22 DIAGNOSIS — J90 Pleural effusion, not elsewhere classified: Secondary | ICD-10-CM | POA: Diagnosis not present

## 2023-04-22 DIAGNOSIS — K648 Other hemorrhoids: Secondary | ICD-10-CM | POA: Diagnosis not present

## 2023-04-22 DIAGNOSIS — K640 First degree hemorrhoids: Secondary | ICD-10-CM | POA: Diagnosis not present

## 2023-04-22 DIAGNOSIS — I509 Heart failure, unspecified: Secondary | ICD-10-CM | POA: Diagnosis not present

## 2023-04-22 DIAGNOSIS — E785 Hyperlipidemia, unspecified: Secondary | ICD-10-CM | POA: Diagnosis not present

## 2023-04-22 DIAGNOSIS — R10826 Epigastric rebound abdominal tenderness: Secondary | ICD-10-CM | POA: Diagnosis not present

## 2023-04-22 DIAGNOSIS — I21A1 Myocardial infarction type 2: Secondary | ICD-10-CM | POA: Diagnosis not present

## 2023-04-22 DIAGNOSIS — M1990 Unspecified osteoarthritis, unspecified site: Secondary | ICD-10-CM | POA: Diagnosis not present

## 2023-04-22 DIAGNOSIS — J449 Chronic obstructive pulmonary disease, unspecified: Secondary | ICD-10-CM | POA: Diagnosis not present

## 2023-04-26 ENCOUNTER — Encounter: Payer: Self-pay | Admitting: Cardiology

## 2023-05-04 ENCOUNTER — Other Ambulatory Visit: Payer: Medicare Other

## 2023-05-09 ENCOUNTER — Encounter: Payer: Self-pay | Admitting: Cardiology

## 2023-05-09 ENCOUNTER — Ambulatory Visit: Payer: 59 | Attending: Cardiology | Admitting: Cardiology

## 2023-05-09 VITALS — BP 114/60 | HR 70 | Ht 69.0 in | Wt 121.2 lb

## 2023-05-09 DIAGNOSIS — I340 Nonrheumatic mitral (valve) insufficiency: Secondary | ICD-10-CM

## 2023-05-09 DIAGNOSIS — Z8719 Personal history of other diseases of the digestive system: Secondary | ICD-10-CM

## 2023-05-09 DIAGNOSIS — F1910 Other psychoactive substance abuse, uncomplicated: Secondary | ICD-10-CM

## 2023-05-09 DIAGNOSIS — I5020 Unspecified systolic (congestive) heart failure: Secondary | ICD-10-CM | POA: Diagnosis not present

## 2023-05-09 MED ORDER — ALBUTEROL SULFATE HFA 108 (90 BASE) MCG/ACT IN AERS: 2.0000 | INHALATION_SPRAY | Freq: Four times a day (QID) | RESPIRATORY_TRACT | 0 refills | Status: AC | PRN

## 2023-05-09 NOTE — Progress Notes (Signed)
Cardiology Office Note  Date: 05/09/2023   ID: Xavier Martinez, DOB 1951/06/17, MRN [id number]  History of Present Illness: Xavier Martinez is a 72 y.o. male that I met in November 2023.  He is here for a follow-up visit. Records indicate recent hospitalization at [facility] with severe blood loss anemia, hemoglobin 4.4, also UDS positive for cocaine.  He was transferred from [facility], received PRBC transfusions and ultimately underwent EGD and colonoscopy have evidence of demand ischemia in the setting.  Etiology of GI bleed was not determined by endoscopy, he was taken off aspirin and given ferrous sulfate with plan to follow-up with PCP.  No additional cardiac studies were undertaken.  He still has no PCP, has yet to fill out paperwork to reestablish.  We discussed the importance of this today.  He tells me that he has not experienced any chest pain, reporting NYHA class II dyspnea, no leg swelling.  I went over his current medications and he indicates compliance with therapy including losartan, Coreg, Lasix, and Lipitor.  He is no longer taking aspirin and I reinforced staying off.  He did not present for the echocardiogram that we ordered after the last visit.  This has been rescheduled for July.  Plan to get BMET at that time and then follow-up thereafter.  Physical Exam: VS:  BP 114/60   Pulse 70   Ht 5\' 9"  (1.753 m)   Wt 121 lb 3.2 oz (55 kg)   SpO2 96%   BMI 17.90 kg/m , BMI Body mass index is 17.9 kg/m.  Wt Readings from Last 3 Encounters:  05/09/23 121 lb 3.2 oz (55 kg)  10/27/22 126 lb 6.4 oz (57.3 kg)  04/19/19 135 lb (61.2 kg)    General: Patient appears comfortable at rest. HEENT: Conjunctiva and lids normal. Neck: Supple, no elevated JVP or carotid bruits. Lungs: Clear to auscultation, nonlabored breathing at rest. Cardiac: Regular rate and rhythm, no S3 , 2/6 mid-to-late systolic murmur consistent with mitral regurgitation. Extremities: No pitting  edema.  ECG:  An ECG dated 10/27/2022 was personally reviewed today and demonstrated:  Sinus rhythm with increased voltage and nonspecific T wave changes.  Labwork:  May 2024: Hemoglobin 6.5, potassium 4.4, BUN 19, creatinine 1.76, AST 54, ALT 47, high-sensitivity troponin I peak 952, pro-BNP 4945  Other Studies Reviewed Today:  Echocardiogram 07/19/2022 ([facility]): Left Ventricle: EF: 35-40%. Ejection fraction measured by 3D is 36%,    Left Atrium: Left atrium is moderately dilated at 4.700 cm.    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    Mitral Valve: There is moderate to severe regurgitation.    Tricuspid Valve: There is mild to moderate regurgitation.   Assessment and Plan:  1.  HFrEF with history of nonischemic cardiomyopathy based on prior workup through [facility].  LVEF 35 to 40% by echocardiogram in August 2023.  Plan is to obtain echocardiogram as discussed above.  Currently on Coreg, losartan, and Lasix.  Recent lab work shows creatinine 1.76 and potassium 4.4.  Difficult situation with continued cocaine abuse, he would not be a candidate for advanced therapies or ICD.  We will adjust GDMT as able depending on consistency of follow-up, comorbidities, and LVEF going forward.  2.  Mitral regurgitation, moderate to severe by echocardiogram in August 2023.  This will be reassessed by echocardiography as well.  3.  History of polysubstance abuse including recurrent cocaine use.  Importance of cessation has been stressed.  This has been a recurring  problem however.  4.  History of severe recurrent GI bleeding.  Now off aspirin.  5.  Health maintenance, I once again stressed the importance of establishing with a PCP.  Disposition:  Follow up  after echocardiogram and BMET.  Signed, Jonelle Sidle, M.D., F.A.C.C. [facility]

## 2023-05-09 NOTE — Patient Instructions (Addendum)
Medication Instructions:  Your physician recommends that you continue on your current medications as directed. Please refer to the Current Medication list given to you today.  Labwork: BMET on 06/16/2023 at [facility] Non-fasting  Testing/Procedures: Echo in July 2024  Follow-Up: Your physician recommends that you schedule a follow-up appointment in: 4-6 weeks  Any Other Special Instructions Will Be Listed Below (If Applicable).  If you need a refill on your cardiac medications before your next appointment, please call your pharmacy.

## 2023-06-03 ENCOUNTER — Other Ambulatory Visit: Payer: Self-pay | Admitting: Cardiology

## 2023-06-16 ENCOUNTER — Other Ambulatory Visit (HOSPITAL_COMMUNITY)
Admission: RE | Admit: 2023-06-16 | Discharge: 2023-06-16 | Disposition: A | Discharge status: Home / Self Care | Payer: 59 | Source: Ambulatory Visit | Attending: Cardiology | Admitting: Cardiology

## 2023-06-16 ENCOUNTER — Ambulatory Visit (HOSPITAL_COMMUNITY)
Admission: RE | Admit: 2023-06-16 | Discharge: 2023-06-16 | Disposition: A | Discharge status: Home / Self Care | Payer: 59 | Source: Ambulatory Visit | Attending: Cardiology | Admitting: Cardiology

## 2023-06-16 DIAGNOSIS — I5020 Unspecified systolic (congestive) heart failure: Secondary | ICD-10-CM | POA: Diagnosis not present

## 2023-06-16 DIAGNOSIS — I340 Nonrheumatic mitral (valve) insufficiency: Secondary | ICD-10-CM | POA: Diagnosis not present

## 2023-06-16 LAB — BASIC METABOLIC PANEL
Anion gap: 4 — ABNORMAL LOW (ref 5–15)
BUN: 19 mg/dL (ref 8–23)
CO2: 25 mmol/L (ref 22–32)
Calcium: 8.8 mg/dL — ABNORMAL LOW (ref 8.9–10.3)
Chloride: 109 mmol/L (ref 98–111)
Creatinine, Ser: 1.19 mg/dL (ref 0.61–1.24)
GFR, Estimated: >60 mL/min (ref >60)
Glucose, Bld: 93 mg/dL (ref 70–99)
Potassium: 4 mmol/L (ref 3.5–5.1)
Sodium: 138 mmol/L (ref 135–145)

## 2023-06-16 LAB — ECHOCARDIOGRAM COMPLETE
Area-P 1/2: 3.17 cm2
MV M vel: 4.96 m/s
MV Peak grad: 98.4 mmHg
Radius: 0.6 cm
S' Lateral: 3.8 cm

## 2023-06-16 NOTE — Progress Notes (Signed)
*  PRELIMINARY RESULTS* Echocardiogram 2D Echocardiogram has been performed.  Stacey Drain 06/16/2023, 10:24 AM

## 2023-06-17 ENCOUNTER — Encounter: Payer: Self-pay | Admitting: Nurse Practitioner

## 2023-06-17 ENCOUNTER — Ambulatory Visit: Payer: 59 | Attending: Nurse Practitioner | Admitting: Nurse Practitioner

## 2023-06-17 VITALS — BP 122/56 | HR 68 | Ht 69.0 in | Wt 124.8 lb

## 2023-06-17 DIAGNOSIS — I428 Other cardiomyopathies: Secondary | ICD-10-CM

## 2023-06-17 DIAGNOSIS — I5032 Chronic diastolic (congestive) heart failure: Secondary | ICD-10-CM | POA: Diagnosis not present

## 2023-06-17 DIAGNOSIS — I340 Nonrheumatic mitral (valve) insufficiency: Secondary | ICD-10-CM

## 2023-06-17 DIAGNOSIS — I341 Nonrheumatic mitral (valve) prolapse: Secondary | ICD-10-CM

## 2023-06-17 DIAGNOSIS — Z720 Tobacco use: Secondary | ICD-10-CM

## 2023-06-17 DIAGNOSIS — F1010 Alcohol abuse, uncomplicated: Secondary | ICD-10-CM

## 2023-06-17 MED ORDER — CARVEDILOL 3.125 MG PO TABS: 3.1250 mg | ORAL_TABLET | Freq: Two times a day (BID) | ORAL | 3 refills | Status: AC

## 2023-06-17 MED ORDER — FUROSEMIDE 20 MG PO TABS: 20.0000 mg | ORAL_TABLET | Freq: Every day | ORAL | 3 refills | Status: AC

## 2023-06-17 MED ORDER — ATORVASTATIN CALCIUM 80 MG PO TABS: 80.0000 mg | ORAL_TABLET | Freq: Every day | ORAL | 3 refills | Status: AC

## 2023-06-17 MED ORDER — LOSARTAN POTASSIUM 25 MG PO TABS: 25.0000 mg | ORAL_TABLET | Freq: Every day | ORAL | 1 refills | Status: DC

## 2023-06-17 MED ORDER — NITROGLYCERIN 0.4 MG SL SUBL: 0.4000 mg | SUBLINGUAL_TABLET | SUBLINGUAL | 1 refills | Status: AC | PRN

## 2023-06-17 NOTE — Patient Instructions (Signed)
Medication Instructions:  Your physician recommends that you continue on your current medications as directed. Please refer to the Current Medication list given to you today.   Labwork: None  Testing/Procedures: None  Follow-Up: Your physician recommends that you schedule a follow-up appointment in: 6 months  Any Other Special Instructions Will Be Listed Below (If Applicable).  If you need a refill on your cardiac medications before your next appointment, please call your pharmacy. Alcohol Use Disorder Alcohol use disorder is a condition in which drinking disrupts daily life. People with this condition drink too much alcohol and cannot control their drinking. Alcohol use disorder can cause serious problems with physical health. It can affect the brain, heart, and other internal organs. This disorder can raise the risk for certain cancers and cause problems with mental health, such as depression or anxiety. What are the causes? This condition is caused by drinking too much alcohol over time. Some people with this condition drink to cope with or escape from negative life events. Others drink to relieve symptoms of physical pain or symptoms of mental illness. What increases the risk? You are more likely to develop this condition if: You have a family history of alcohol use disorder. Your culture encourages drinking to the point of becoming drunk (intoxication). You had a mood or conduct disorder in childhood. You have been abused. You are an adolescent and you: Have poor performance in school. Have poor supervision or guidance. Act on impulse and like taking risks. What are the signs or symptoms? Symptoms of this condition include: Drinking more than you want to. Trying several times without success to drink less. Spending a lot of time thinking about alcohol, getting alcohol, drinking alcohol, or recovering from drinking alcohol. Continuing to drink even when it is causing serious  problems in your daily life. Drinking when it is dangerous to drink, such as before driving a car. Needing more and more alcohol to get the same effect you want (building up tolerance). Having symptoms of withdrawal when you stop drinking. Withdrawal symptoms may include: Trouble sleeping, leading to tiredness (fatigue). Mood swings of depression and anxiety. Physical symptoms, such as a fast heart rate, rapid breathing, high blood pressure (hypertension), fever, cold sweats, or nausea. Seizures. Severe confusion. Feeling or seeing things that are not there (hallucinations). Shaking movements that you cannot control (tremors). How is this diagnosed? This condition is diagnosed with an assessment. Your health care provider may start by asking three or four questions about your drinking, or they may give you a simple test to take. This helps to get clear information from you. You may also have a physical exam or lab tests. You may be referred to a substance abuse counselor. How is this treated? With education, some people with alcohol use disorder are able to reduce their drinking. Many with this disorder cannot change their drinking behavior on their own and need help with treatment from substance use specialists. Treatments may include: Detoxification. Detoxification involves quitting drinking with supervision and direction of health care providers. Your health care provider may prescribe medicines within the first week to help lessen withdrawal symptoms. Alcohol withdrawal can be dangerous and life-threatening. Detoxification may be provided in a home, community, or primary care setting, or in a hospital or substance use treatment facility. Counseling. This may involve motivational interviewing (MI), family therapy, or cognitive behavioral therapy (CBT). A counselor can address the things you can do to change your drinking behavior and how to maintain the changes. Talk  therapy aims to: Identify  your positive motivations to change. Identify and avoid the things that trigger your drinking. Help you learn how to plan your behavior change. Develop support systems that can help you sustain the change. Medicines. Medicines can help treat this disorder by: Decreasing cravings. Decreasing the positive feeling you have when you drink. Causing an uncomfortable physical reaction when you drink (aversion therapy). Support groups such as Alcoholics Anonymous (AA). These groups are led by people who have quit drinking. The groups provide emotional support, advice, and guidance. Some people with this condition benefit from a combination of treatments provided by specialized substance use treatment centers. Follow these instructions at home:  Medicines Take over-the-counter and prescription medicines only as told by your health care provider. Ask before starting any new medicines, herbs, or supplements. General instructions Ask friends and family members to support your choice to stay sober. Avoid places where alcohol is served. Create a plan to deal with tempting situations. Attend support groups regularly. Practice hobbies or activities you enjoy. Do not drink and drive. How is this prevented? Do not drink alcohol if your health care provider tells you not to drink. If you drink alcohol: Limit how much you have to: 0-1 drink a day for women who are not pregnant. 0-2 drinks a day for men. Know how much alcohol is in your drink. In the [place] one drink equals one 12 oz bottle of beer (355 mL), one 5 oz glass of wine (148 mL), or one 1 oz glass of hard liquor (44 mL). If you have a mental health condition, seek treatment. Develop a healthy lifestyle through: Meditation or deep breathing. Exercise. Spending time in nature. Listening to music. Talking with a trusted friend or family member. If you are a teen: Do not drink alcohol. Avoid gatherings where you might be tempted to drink  alcohol. Do not be afraid to say no if someone offers you alcohol. Speak up about why you do not want to drink. Set a positive example for others around you by not drinking. Build relationships with friends who do not drink. Where to find more information Substance Abuse and Mental Health Services Administration: RockToxic.pl Alcoholics Anonymous: CustomizedRugs.fi Contact a health care provider if: You cannot take your medicines as told. Your symptoms get worse or you experience symptoms of withdrawal when you stop drinking. You start drinking again (relapse) and your symptoms get worse. Get help right away if: You have thoughts about hurting yourself or others. Get help right away if you feel like you may hurt yourself or others, or have thoughts about taking your own life. Go to your nearest emergency room or: Call 911. Call the National Suicide Prevention Lifeline at 240-290-5185 or 988. This is open 24 hours a day. Text the Crisis Text Line at 613 664 9763. Summary Alcohol use disorder is a condition in which drinking disrupts daily life. People with this condition drink too much alcohol and cannot control their drinking. Treatment may include detoxification, counseling, medicines, and support groups. Ask friends and family members to support you. Avoid situations where alcohol is served. Get help right away if you have thoughts about hurting yourself or others. This information is not intended to replace advice given to you by your health care provider. Make sure you discuss any questions you have with your health care provider. Document Revised: 01/20/2022 Document Reviewed: 01/20/2022 Elsevier Patient Education  2024 Elsevier Inc. Smoking Tobacco Information, Adult Smoking tobacco can be harmful to your health. Tobacco contains  a toxic colorless chemical called nicotine. Nicotine causes changes in your brain that make you want more and more. This is called addiction. This can make it hard to stop  smoking once you start. Tobacco also has other toxic chemicals that can hurt your body and raise your risk of many cancers. Menthol or "lite" tobacco or cigarette brands are not safer than regular brands. How can smoking tobacco affect me? Smoking tobacco puts you at risk for: Cancer. Smoking is most commonly associated with lung cancer, but can also lead to cancer in other parts of the body. Chronic obstructive pulmonary disease (COPD). This is a long-term lung condition that makes it hard to breathe. It also gets worse over time. High blood pressure (hypertension), heart disease, stroke, heart attack, and lung infections, such as pneumonia. Cataracts. This is when the lenses in the eyes become clouded. Digestive problems. This may include peptic ulcers, heartburn, and gastroesophageal reflux disease (GERD). Oral health problems, such as gum disease, mouth sores, and tooth loss. Loss of taste and smell. Smoking also affects how you look and smell. Smoking may cause: Wrinkles. Yellow or stained teeth, fingers, and fingernails. Bad breath. Bad-smelling clothes and hair. Smoking tobacco can also affect your social life, because: It may be challenging to find places to smoke when away from home. Many workplaces, [employer], hotels, and public places are tobacco-free. Smoking is expensive. This is due to the cost of tobacco and the long-term costs of treating health problems from smoking. Secondhand smoke may affect those around you. Secondhand smoke can cause lung cancer, breathing problems, and heart disease. Children of smokers have a higher risk for: Sudden infant death syndrome (SIDS). Ear infections. Lung infections. What actions can I take to prevent health problems? Quit smoking  Do not start smoking. Quit if you already smoke. Do not replace cigarette smoking with vaping devices, such as e-cigarettes. Make a plan to quit smoking and commit to it. Look for programs to help you, and  ask your health care provider for recommendations and ideas. Set a date and write down all the reasons you want to quit. Let your friends and family know you are quitting so they can help and support you. Consider finding friends who also want to quit. It can be easier to quit with someone else, so that you can support each other. Talk with your health care provider about using nicotine replacement medicines to help you quit. These include gum, lozenges, patches, sprays, or pills. If you try to quit but return to smoking, stay positive. It is common to slip up when you first quit, so take it one day at a time. Be prepared for cravings. When you feel the urge to smoke, chew gum or suck on hard candy. Lifestyle Stay busy. Take care of your body. Get plenty of exercise, eat a healthy diet, and drink plenty of water. Find ways to manage your stress, such as meditation, yoga, exercise, or time spent with friends and family. Ask your health care provider about having regular tests (screenings) to check for cancer. This may include blood tests, imaging tests, and other tests. Where to find support To get support to quit smoking, consider: Asking your health care provider for more information and resources. Joining a support group for people who want to quit smoking in your local community. There are many effective programs that may help you to quit. Calling the smokefree.gov counselor helpline at 1-800-QUIT-NOW (408)378-2075). Where to find more information You may find more  information about quitting smoking from: Centers for Disease Control and Prevention: http://www.osborne.com/ BankRights.uy: smokefree.gov American Lung Association: freedomfromsmoking.org Contact a health care provider if: You have problems breathing. Your lips, nose, or fingers turn blue. You have chest pain. You are coughing up blood. You feel like you will faint. You have other health changes that cause you to  worry. Summary Smoking tobacco can negatively affect your health, the health of those around you, your finances, and your social life. Do not start smoking. Quit if you already smoke. If you need help quitting, ask your health care provider. Consider joining a support group for people in your local community who want to quit smoking. There are many effective programs that may help you to quit. This information is not intended to replace advice given to you by your health care provider. Make sure you discuss any questions you have with your health care provider. Document Revised: 11/10/2021 Document Reviewed: 11/10/2021 Elsevier Patient Education  2024 [employer].

## 2023-06-17 NOTE — Progress Notes (Unsigned)
Cardiology Office Note:  .   Date:  06/17/2023 ID:  Xavier Martinez, DOB 1951/01/17, MRN [id number] PCP: Xavier Chimera, MD  [facility] Providers Cardiologist:  Xavier Dell, MD { History of Present Illness: .   Xavier Martinez is a 72 y.o. male with a PMH of HFimpEF/NICM, MR, hx of severe recurrent GI bleeding (no longer on ASA), hx of polysubstance abuse, who presents today for scheduled follow-up.   TTE 06/2022 EF 35-40%.   Last seen by Dr. Diona Martinez on May 09, 2023. Prior to office visit, he had been recently hospitalized for severe blood loss anemia, UDS positive for cocaine. Underwent EGD and colonoscopy, had evidence of demand ischemia. Was eventually taken off aspirin. Was overall doing well from a cardiac perspective at [facility] with Dr. Diona Martinez. Dr. Diona Martinez updated Echo that showed recovered EF at 55-60%, mild mitral valve prolapse with mild MR.   Today he presents for follow-up. He states he is doing well. Denies any chest pain, shortness of breath, palpitations, syncope, presyncope, dizziness, orthopnea, PND, swelling or significant weight changes, acute bleeding, or claudication. Tolerating medications well. Has quit cocaine 1 month or so ago. I congratulated him. Now is seeing Dr. Donzetta Martinez as his PCP.   SH: Smokes 5 cigarettes per day, trying to wean himself off nicotine, drinks around 2-3 beers per day.   Studies Reviewed: .    Echo 05/2023:  1. Left ventricular ejection fraction, by estimation, is 55 to 60%. The  left ventricle has normal function. The left ventricle has no regional  wall motion abnormalities. Left ventricular diastolic parameters are  indeterminate.   2. Right ventricular systolic function is normal. The right ventricular  size is normal. There is normal pulmonary artery systolic pressure.   3. Left atrial size was mild to moderately dilated.   4. Right atrial size was mildly dilated.   5. The mitral valve is abnormal. Anterior mitral valve  leaflet appears to  have mild prolapse but M-mode does not show any evidence of mitral valve  prolapse. There are two MR jets, directed centrally. Mild mitral valve  regurgitation. No evidence of  mitral stenosis.   6. The aortic valve is tricuspid. Aortic valve regurgitation is not  visualized. No aortic stenosis is present.   7. The inferior vena cava is normal in size with greater than 50%  respiratory variability, suggesting right atrial pressure of 3 mmHg.   Comparison(s): No significant change from prior study.  Physical Exam:   VS:  BP (!) 122/56   Pulse 68   Ht 5\' 9"  (1.753 m)   Wt 124 lb 12.8 oz (56.6 kg)   SpO2 97%   BMI 18.43 kg/m    Wt Readings from Last 3 Encounters:  06/17/23 124 lb 12.8 oz (56.6 kg)  05/09/23 121 lb 3.2 oz (55 kg)  10/27/22 126 lb 6.4 oz (57.3 kg)    GEN: Thin, 72 y.o. male in no acute distress NECK: No JVD; No carotid bruits CARDIAC: S1/S2, RRR, no murmurs, rubs, gallops RESPIRATORY:  Clear to auscultation without rales, wheezing or rhonchi  ABDOMEN: Soft, non-tender, non-distended EXTREMITIES:  No edema; No deformity   ASSESSMENT AND PLAN: .    HFimpEF, NICM Stage C, NYHA class I. EF improved to 55-60%. Euvolemic and well compensated on exam. Continue Coreg, Losartan, and Lasix. Low sodium diet, fluid restriction <2L, and daily weights encouraged. Educated to contact our office for weight gain of 2 lbs overnight or 5 lbs in one week.  Will provide refills per his request.   2. Mitral regurgitation, mild mitral valve prolapse Mild MR and mitral valve prolapse noted on TTE. MR has improved from previous studies, previous study 10/2022 showed overall severe MR. This has overall likely improved after quitting cocaine. Denies any symptoms. Will continue to monitor. Plan to update study in next 3-5 years or sooner if clinically indicated.   3. Alcohol abuse Discussed importance of weaning/cutting back on alcohol use. He verbalized understanding.    4. Tobacco use Smoking cessation encouraged and discussed. He verbalized understanding.   Dispo: Will provide refills per his request. Follow-up with me or APP in 6 months or sooner if anything changes.   Signed, Xavier Dory, NP

## 2023-07-26 DIAGNOSIS — I429 Cardiomyopathy, unspecified: Secondary | ICD-10-CM | POA: Diagnosis not present

## 2023-07-26 DIAGNOSIS — R030 Elevated blood-pressure reading, without diagnosis of hypertension: Secondary | ICD-10-CM | POA: Diagnosis not present

## 2023-07-26 DIAGNOSIS — Z681 Body mass index (BMI) 19 or less, adult: Secondary | ICD-10-CM | POA: Diagnosis not present

## 2023-07-26 DIAGNOSIS — Z8719 Personal history of other diseases of the digestive system: Secondary | ICD-10-CM | POA: Diagnosis not present

## 2023-07-26 DIAGNOSIS — F17200 Nicotine dependence, unspecified, uncomplicated: Secondary | ICD-10-CM | POA: Diagnosis not present

## 2023-08-31 DIAGNOSIS — F17210 Nicotine dependence, cigarettes, uncomplicated: Secondary | ICD-10-CM | POA: Diagnosis not present

## 2023-08-31 DIAGNOSIS — Z122 Encounter for screening for malignant neoplasm of respiratory organs: Secondary | ICD-10-CM | POA: Diagnosis not present

## 2023-09-04 NOTE — Progress Notes (Unsigned)
Referring Provider: Donetta Potts, MD Primary Care Physician:  Donetta Potts, MD Primary Gastroenterologist:  Dr. Marletta Lor  Chief Complaint  Patient presents with   Hepatitis C    Hep C treatment     HPI:   Xavier Martinez is a 72 y.o. male presenting today at the request of Donetta Potts, MD for Hepatitis C.   Labs included in referral dated 08/19/2023 with hemoglobin 10.6 (L), platelets 176, creatinine 1.31 (H), AST 36 (H), ALT 21, alk phos 55, total bilirubin 0.3.  Acute hepatitis panel 08/25/2023 with hepatitis C antibody reactive with hep C quantitation 98,200, hepatitis A antibody IgM negative, hepatitis B surface antigen negative, hepatitis B core antibody IgM negative.  Per chart review, UDS positive for cocaine 04/21/23.   No recent abdominal imaging on file.  Today:  Reports he was diagnosed with Hep C in the 80s.  No known Hep C contacts.  Drug use in the 89s in [place]. States he can't remember what he used, but it was intranasal.  Last used cocaine a couple months ago. States he has, "nipped this in the bud". Drinks a couple beer a day.   No blood transfusions prior to 1990. No tattoos.   No abdominal distention, lower extremity edema, yellowing of the eyes, bruising, bleeding.  Per chart review, patient was recently admitted with severe anemia.  Presented to [facility] 04/21/2023 with shortness of breath, chest tightness, epigastric pain, and also reported dark stools x 2 weeks prior to presentation that resolved the day of.  He was found to have blood loss anemia with hemoglobin of 4.4. FOBT negative.  He was also found to have proBNP 4945, UDS positive for cocaine, EKG with diffuse ischemia with ST elevations, troponin 831>> 952.  Looks like he received 3 units prbcs with hemoglobin initially improved to 9.0. He was transferred to [facility] on 5/24 where GI and cardiology were consulted. It was suspected the elevated troponin was  secondary to demand ischemia, ST changes secondary to cardiomyopathy and history of LVH.  He underwent EGD and colonoscopy 5/27 with normal exams aside from grade 1 hemorrhoids.  He was advised to discontinue aspirin, continue iron daily, follow-up with PCP in 1 week.  Hemoglobin was fairly stable at 8.6 on 5/27 which was his day of discharge.  Still taking iron daily. No brbpr or melena, constipation, or diarrhea, reflux symptoms, nausea, vomiting, abdominal pain.   Reports he used to take [facility] powders intermittently for back pain. None in 6-8 months.    Past Medical History:  Diagnosis Date   Alcohol use    Anemia    Cocaine use    COPD (chronic obstructive pulmonary disease) (HCC)    GERD (gastroesophageal reflux disease)    Nonischemic cardiomyopathy (HCC)    Tobacco use     Past Surgical History:  Procedure Laterality Date   MANDIBLE FRACTURE SURGERY Bilateral 2013   TENDON REPAIR Left 08/11/2017   Procedure: TENDON REPAIR WITH GRAFT AND TENDON TRANSFER;  Surgeon: Betha Loa, MD;  Location: [facility];  Service: Orthopedics;  Laterality: Left;   WOUND EXPLORATION Left 08/11/2017   Procedure: LEFT ARM WOUND EXPLORATION;  Surgeon: Betha Loa, MD;  Location: [facility];  Service: Orthopedics;  Laterality: Left;   WRIST ARTHROSCOPY Right 1987    Current Outpatient Medications  Medication Sig Dispense Refill   albuterol (VENTOLIN HFA) 108 (90 Base) MCG/ACT inhaler Inhale 2 puffs into the lungs every 6 (  six) hours as needed for wheezing. 6.7 g 0   atorvastatin (LIPITOR) 80 MG tablet Take 1 tablet (80 mg total) by mouth daily. 90 tablet 3   carvedilol (COREG) 3.125 MG tablet Take 1 tablet (3.125 mg total) by mouth 2 (two) times daily with a meal. 180 tablet 3   Cholecalciferol (VITAMIN D3) 50 MCG (2000 UT) capsule Take by mouth.     ferrous sulfate 325 (65 FE) MG tablet Take 325 mg by mouth daily with breakfast.     furosemide (LASIX) 20 MG tablet Take 1  tablet (20 mg total) by mouth daily. 90 tablet 3   losartan (COZAAR) 25 MG tablet Take 1 tablet (25 mg total) by mouth daily. 90 tablet 1   nitroGLYCERIN (NITROSTAT) 0.4 MG SL tablet Place 1 tablet (0.4 mg total) under the tongue every 5 (five) minutes x 3 doses as needed for chest pain (if no relief after 3rd dose proceed to ED or call 911). 25 tablet 1   pantoprazole (PROTONIX) 40 MG tablet Take 1 tablet (40 mg total) by mouth daily. 90 tablet 3   No current facility-administered medications for this visit.    Allergies as of 09/05/2023   (No Known Allergies)    Family History  Problem Relation Age of Onset   Heart disease Mother     Social History   Socioeconomic History   Marital status: Single    Spouse name: Not on file   Number of children: Not on file   Years of education: Not on file   Highest education level: Not on file  Occupational History   Not on file  Tobacco Use   Smoking status: Every Day    Current packs/day: 0.50    Average packs/day: 0.5 packs/day for 25.0 years (12.5 ttl pk-yrs)    Types: Cigarettes   Smokeless tobacco: Never  Vaping Use   Vaping status: Never Used  Substance and Sexual Activity   Alcohol use: Yes    Alcohol/week: 10.0 standard drinks of alcohol    Types: 10 Cans of beer per week    Comment: 2 beer daily.   Drug use: Not Currently    Types: "Crack" cocaine    Comment: Reports last use was around August 2024.   Sexual activity: Not on file  Other Topics Concern   Not on file  Social History Narrative   Not on file   Social Determinants of Health   Financial Resource Strain: Medium Risk (07/18/2022)   [facility]   Overall Financial Resource Strain (CARDIA)    Difficulty of Paying Living Expenses: Somewhat hard  Food Insecurity: Low Risk  (04/22/2023)   [facility]   Hunger Vital Sign    Worried About Running Out of Food in the Last Year: Never true    Ran Out of  Food in the Last Year: Never true  Transportation Needs: Unmet Transportation Needs (04/22/2023)   [facility]   Transportation    In the past 12 months, has lack of reliable transportation kept you from medical appointments, meetings, work or from getting things needed for daily living? : Yes  Physical Activity: Not on file  Stress: No Stress Concern Present (07/18/2022)   Received from [facility], [facility] - Occupational Stress Questionnaire    Feeling of Stress : Not at all  Social Connections: Unknown (04/13/2022)   [facility] from [facility], [facility]   Social Network    Social Network: Not on file  Intimate Partner Violence: Unknown (03/05/2022)   [facility], [facility]   HITS    Physically Hurt: Not on file    Insult or Talk Down To: Not on file    Threaten Physical Harm: Not on file    Scream or Curse: Not on file    Review of Systems: Gen: Denies any fever, chills, cold or flulike symptoms, presyncope, syncope. CV: Denies chest pain, heart palpitations. Resp: Denies shortness of breath, cough.  GI: See HPI GU : Denies urinary burning, urinary frequency, urinary hesitancy MS: Denies joint pain. Derm: Denies rash. Psych: Denies depression, anxiety. Heme: See HPI  Physical Exam: BP 136/80 (BP Location: Right Arm, Patient Position: Sitting, Cuff Size: Normal)   Pulse 70   Temp 97.7 F (36.5 C) (Temporal)   Ht 5\' 9"  (1.753 m)   Wt 132 lb (59.9 kg)   SpO2 96%   BMI 19.49 kg/m  General:   Alert and oriented. Pleasant and cooperative. Well-nourished and well-developed.  Head:  Normocephalic and atraumatic. Eyes:  Without icterus, sclera clear and conjunctiva pink.  Ears:  Normal auditory acuity. Lungs:  Clear to auscultation bilaterally. No wheezes, rales, or rhonchi. No distress.  Heart:  S1, S2 present without murmurs appreciated.  Abdomen:  +BS, soft, non-tender  and non-distended. No HSM noted. No guarding or rebound. No masses appreciated.  Rectal:  Deferred  Msk:  Symmetrical without gross deformities. Normal posture. Extremities:  Without edema. Neurologic:  Alert and  oriented x4;  grossly normal neurologically. Skin:  Intact without significant lesions or rashes. Psych:  Normal mood and affect.    Assessment:  72 year old male with history of alcohol use, illicit drug use, COPD, nonischemic cardiomyopathy, GERD, chronic hep C, admission earlier this year with acute symptomatic anemia in the setting of melena, presenting today at the request of Dr. Mitzi Hansen for hepatitis C.   Chronic hepatitis C: Patient reports diagnosis in the 80s, but never treated.  Recent lab 08/25/2023 with hep C antibody reactive, hep C quantitation 98,200.  Suspect patient likely contracted hep C from intranasal drug use.  Reports he last used cocaine a couple months ago, stating he has quit.  Also drinks a couple beers a day.  Recent labs on 9/20 with slight elevation of AST at 36, otherwise LFTs within normal limits.  Platelets also normal.  No symptoms of decompensated liver disease.  No recent abdominal imaging on file.  Will need to complete some additional pretreatment labs along with ultrasound with elastography prior to considering hep C treatment.  He will also need to work towards alcohol cessation prior to starting treatment.  Acute symptomatic anemia: Admitted in May 2024 with acute symptomatic anemia with a hemoglobin of 4.4.  Patient reported melena prior to admission, but resolved the day he presented to the hospital.  FOBT was negative.  His iron panel was within normal limits though this was checked after receiving 3 units PRBCs and may have been artificially elevated.  He underwent EGD and colonoscopy 5/27 with normal exams aside from grade 1 hemorrhoids.  He was advised to discontinue aspirin, continue iron daily, follow-up with PCP in 1 week.   Hemoglobin was fairly stable at 8.6 on 5/27 which was his day of discharge.  Most recent hemoglobin improved to 10.6 on 08/19/2023. Clinically he is doing well with no recurrent GI bleeding. Denies any NSAID use currently but  admits to intermittent use of BC powders previously, none in 6 to 8 months.   As no explanation of anemia or melena was identified on EGD or colonoscopy, I recommended proceeding with givens capsule to complete GI evaluation.   Plan:  Givens capsule Abdominal ultrasound with elastography Hepatitis B surface antibody, hepatitis B core antibody total, hepatitis C genotype, hepatitis A antibody, HIV antibody, INR, CBC, CMP, iron panel. Work towards complete alcohol cessation. Follow-up in office after a givens capsule.  Will plan to discuss starting hep C treatment at that time.   Ermalinda Memos, PA-C [facility]astroenter[facility] 09/05/2023

## 2023-09-05 ENCOUNTER — Encounter: Payer: Self-pay | Admitting: Gastroenterology

## 2023-09-05 ENCOUNTER — Telehealth: Payer: Self-pay | Admitting: *Deleted

## 2023-09-05 ENCOUNTER — Ambulatory Visit (INDEPENDENT_AMBULATORY_CARE_PROVIDER_SITE_OTHER): Payer: 59 | Admitting: Gastroenterology

## 2023-09-05 VITALS — BP 136/80 | HR 70 | Temp 97.7°F | Ht 69.0 in | Wt 132.0 lb

## 2023-09-05 DIAGNOSIS — D649 Anemia, unspecified: Secondary | ICD-10-CM | POA: Diagnosis not present

## 2023-09-05 DIAGNOSIS — B182 Chronic viral hepatitis C: Secondary | ICD-10-CM | POA: Insufficient documentation

## 2023-09-05 NOTE — Patient Instructions (Signed)
We will get you scheduled to have an ultrasound of your abdomen at [facility].  Please have blood work completed at [facility] when you have your ultrasound.  We will also get you scheduled for a givens capsule to evaluate your small bowel due to your admission earlier this year with severe anemia (low hemoglobin).  As we discussed, I would like for you to work towards alcohol cessation so that we can start hep C treatment in the near future.   We will follow-up with you in the office after your givens capsule to hopefully discuss starting hep C treatment at that time.  It was nice to meet you today!  Ermalinda Memos, PA-C [facility] Gastroenterology

## 2023-09-05 NOTE — Telephone Encounter (Signed)
Spoke with pt to schedule givens capsule study and gave [place] appt details. He wanted givens in Nov. Had a lot of appts this month. He has been scheduled for 11/7. Aware to hold Iron x 7 days prior. Advised will send instructions to him.

## 2023-09-07 ENCOUNTER — Other Ambulatory Visit (HOSPITAL_COMMUNITY)
Admission: RE | Admit: 2023-09-07 | Discharge: 2023-09-07 | Disposition: A | Discharge status: Home / Self Care | Payer: 59 | Source: Ambulatory Visit | Attending: Gastroenterology | Admitting: Gastroenterology

## 2023-09-07 DIAGNOSIS — D649 Anemia, unspecified: Secondary | ICD-10-CM | POA: Insufficient documentation

## 2023-09-07 DIAGNOSIS — B182 Chronic viral hepatitis C: Secondary | ICD-10-CM | POA: Diagnosis present

## 2023-09-07 LAB — COMPREHENSIVE METABOLIC PANEL
ALT: 27 U/L (ref 0–44)
AST: 48 U/L — ABNORMAL HIGH (ref 15–41)
Albumin: 3.5 g/dL (ref 3.5–5.0)
Alkaline Phosphatase: 53 U/L (ref 38–126)
Anion gap: 6 (ref 5–15)
BUN: 13 mg/dL (ref 8–23)
CO2: 25 mmol/L (ref 22–32)
Calcium: 8.8 mg/dL — ABNORMAL LOW (ref 8.9–10.3)
Chloride: 108 mmol/L (ref 98–111)
Creatinine, Ser: 1.09 mg/dL (ref 0.61–1.24)
GFR, Estimated: >60 mL/min (ref >60)
Glucose, Bld: 68 mg/dL — ABNORMAL LOW (ref 70–99)
Potassium: 3.7 mmol/L (ref 3.5–5.1)
Sodium: 139 mmol/L (ref 135–145)
Total Bilirubin: 0.6 mg/dL (ref 0.3–1.2)
Total Protein: 8.2 g/dL — ABNORMAL HIGH (ref 6.5–8.1)

## 2023-09-07 LAB — CBC WITH DIFFERENTIAL/PLATELET
Abs Immature Granulocytes: 0.01 10*3/uL (ref 0.00–0.07)
Basophils Absolute: 0 10*3/uL (ref 0.0–0.1)
Basophils Relative: 1 %
Eosinophils Absolute: 0.2 10*3/uL (ref 0.0–0.5)
Eosinophils Relative: 5 %
HCT: 32.7 % — ABNORMAL LOW (ref 39.0–52.0)
Hemoglobin: 10.4 g/dL — ABNORMAL LOW (ref 13.0–17.0)
Immature Granulocytes: 0 %
Lymphocytes Relative: 33 %
Lymphs Abs: 1.1 10*3/uL (ref 0.7–4.0)
MCH: 28 pg (ref 26.0–34.0)
MCHC: 31.8 g/dL (ref 30.0–36.0)
MCV: 87.9 fL (ref 80.0–100.0)
Monocytes Absolute: 0.6 10*3/uL (ref 0.1–1.0)
Monocytes Relative: 16 %
Neutro Abs: 1.6 10*3/uL — ABNORMAL LOW (ref 1.7–7.7)
Neutrophils Relative %: 45 %
Platelets: 182 10*3/uL (ref 150–400)
RBC: 3.72 MIL/uL — ABNORMAL LOW (ref 4.22–5.81)
RDW: 16 % — ABNORMAL HIGH (ref 11.5–15.5)
WBC: 3.4 10*3/uL — ABNORMAL LOW (ref 4.0–10.5)
nRBC: 0 % (ref 0.0–0.2)

## 2023-09-07 LAB — IRON AND TIBC
Iron: 26 ug/dL — ABNORMAL LOW (ref 45–182)
Saturation Ratios: 6 % — ABNORMAL LOW (ref 17.9–39.5)
TIBC: 410 ug/dL (ref 250–450)
UIBC: 384 ug/dL

## 2023-09-07 LAB — HIV ANTIBODY (ROUTINE TESTING W REFLEX): HIV Screen 4th Generation wRfx: NONREACTIVE

## 2023-09-07 LAB — FERRITIN: Ferritin: 8 ng/mL — ABNORMAL LOW (ref 24–336)

## 2023-09-07 LAB — PROTIME-INR
INR: 1 (ref 0.8–1.2)
Prothrombin Time: 13.8 s (ref 11.4–15.2)

## 2023-09-07 LAB — HEPATITIS A ANTIBODY, TOTAL: hep A Total Ab: REACTIVE — AB

## 2023-09-07 LAB — HEPATITIS B CORE ANTIBODY, IGM: Hep B C IgM: NONREACTIVE

## 2023-09-07 LAB — HEPATITIS B SURFACE ANTIBODY,QUALITATIVE: Hep B S Ab: REACTIVE — AB

## 2023-09-09 ENCOUNTER — Other Ambulatory Visit: Payer: Self-pay | Admitting: *Deleted

## 2023-09-09 DIAGNOSIS — D649 Anemia, unspecified: Secondary | ICD-10-CM

## 2023-09-09 LAB — HEPATITIS C GENOTYPE

## 2023-09-13 ENCOUNTER — Encounter: Payer: Self-pay | Admitting: Oncology

## 2023-09-13 ENCOUNTER — Inpatient Hospital Stay: Payer: 59 | Attending: Oncology | Admitting: Oncology

## 2023-09-13 ENCOUNTER — Inpatient Hospital Stay: Payer: 59

## 2023-09-13 VITALS — BP 129/68 | HR 70 | Temp 97.9°F | Resp 16 | Ht 69.0 in | Wt 130.7 lb

## 2023-09-13 DIAGNOSIS — D500 Iron deficiency anemia secondary to blood loss (chronic): Secondary | ICD-10-CM | POA: Diagnosis not present

## 2023-09-13 DIAGNOSIS — I739 Peripheral vascular disease, unspecified: Secondary | ICD-10-CM | POA: Diagnosis not present

## 2023-09-13 DIAGNOSIS — F17210 Nicotine dependence, cigarettes, uncomplicated: Secondary | ICD-10-CM | POA: Insufficient documentation

## 2023-09-13 DIAGNOSIS — F17200 Nicotine dependence, unspecified, uncomplicated: Secondary | ICD-10-CM | POA: Insufficient documentation

## 2023-09-13 DIAGNOSIS — D72819 Decreased white blood cell count, unspecified: Secondary | ICD-10-CM | POA: Diagnosis not present

## 2023-09-13 DIAGNOSIS — B182 Chronic viral hepatitis C: Secondary | ICD-10-CM | POA: Diagnosis not present

## 2023-09-13 MED ORDER — FERROUS SULFATE 325 (65 FE) MG PO TABS: 325.0000 mg | ORAL_TABLET | ORAL | 3 refills | Status: DC

## 2023-09-13 NOTE — Assessment & Plan Note (Signed)
-  Likely secondary to chronic blood loss with a history of melena -No symptoms of fatigue, shortness of breath, abdominal pain, or blood in stools. Patient was previously on iron pills but ran out. No constipation reported with iron pills. -Resume iron pills every other day. -Administer IV iron in two doses starting next week. We discussed some of the risks, benefits, and alternatives of intravenous iron infusions. The patient is symptomatic from anemia and the iron level is critically low. Will consider IV Iron to achieve higher levels of iron faster for adequate hematopoesis. Some of the side-effects to be expected including risks of infusion reactions, phlebitis, headaches, nausea and fatigue.  The patient is willing to proceed. Patient education material was dispensed. Goal is to keep ferritin level greater than 50 and resolution of anemia -Follows with GI and is planned for capsule endoscopy soon  RTC in 1 month with labs

## 2023-09-13 NOTE — Progress Notes (Signed)
[facility] HEMATOLOGY NEW VISIT  Donetta Potts, MD  REASON FOR REFERRAL: Iron deficiency anemia  SUMMARY OF HEMATOLOGIC HISTORY:    Latest Ref Rng & Units 09/07/2023   12:47 PM 08/01/2017   10:10 PM  CBC  WBC 4.0 - 10.5 K/uL 3.4  4.0   Hemoglobin 13.0 - 17.0 g/dL 16.1  09.6   Hematocrit 39.0 - 52.0 % 32.7  36.0   Platelets 150 - 400 K/uL 182  147    Lab Results  Component Value Date   IRON 26 (L) 09/07/2023   TIBC 410 09/07/2023   FERRITIN 8 (L) 09/07/2023     HISTORY OF PRESENT ILLNESS: Xavier Martinez 72 y.o. male referred for iron deficiency anemia and probable IV iron infusion by his GI physician.  He came to the clinic unaccompanied today.  He has a past medical history of hepatitis C infection, heart failure with reduced ejection fraction, COPD.He denies any symptoms of fatigue, tiredness, shortness of breath, abdominal pain, or blood in stools.He also reports frequent urination due to fluid pills he is taking for swelling in his legs.   He was admitted to the hospital at [facility] on 04/21/2023 with shortness of breath, epigastric pain and also had dark-colored stools.  He had a symptomatic anemia with a hemoglobin of 4.4 and had EGD and colonoscopy done at that time which were reportedly normal.  He is following with GI at [facility] gastroenterology and is planned to have a capsule endoscopy done.He was previously on iron supplements for anemia but has run out and has not been taking them for a few days.   He reports smoking half a pack of cigarettes per day and consuming three beers daily for the past twenty years.  He has no family history of colon cancer or blood disorders.  He lives in [place] by himself.  Patient denies current drug use but was found to be positive for cocaine at the recent admission.  I have reviewed the past medical history, past surgical history, social history and family history with the patient    ALLERGIES:  has No Known Allergies.  MEDICATIONS:  Current Outpatient Medications  Medication Sig Dispense Refill   albuterol (VENTOLIN HFA) 108 (90 Base) MCG/ACT inhaler Inhale 2 puffs into the lungs every 6 (six) hours as needed for wheezing. 6.7 g 0   atorvastatin (LIPITOR) 80 MG tablet Take 1 tablet (80 mg total) by mouth daily. 90 tablet 3   carvedilol (COREG) 3.125 MG tablet Take 1 tablet (3.125 mg total) by mouth 2 (two) times daily with a meal. 180 tablet 3   Cholecalciferol (VITAMIN D3) 50 MCG (2000 UT) capsule Take by mouth.     furosemide (LASIX) 20 MG tablet Take 1 tablet (20 mg total) by mouth daily. 90 tablet 3   losartan (COZAAR) 25 MG tablet Take 1 tablet (25 mg total) by mouth daily. 90 tablet 1   nitroGLYCERIN (NITROSTAT) 0.4 MG SL tablet Place 1 tablet (0.4 mg total) under the tongue every 5 (five) minutes x 3 doses as needed for chest pain (if no relief after 3rd dose proceed to ED or call 911). 25 tablet 1   pantoprazole (PROTONIX) 40 MG tablet Take 1 tablet (40 mg total) by mouth daily. 90 tablet 3   ferrous sulfate 325 (65 FE) MG tablet Take 1 tablet (325 mg total) by mouth every other day. 30 tablet 3   No current facility-administered medications for this  visit.     REVIEW OF SYSTEMS:   Constitutional: Denies fevers, chills or night sweats Eyes: Denies blurriness of vision Ears, nose, mouth, throat, and face: Denies mucositis or sore throat Respiratory: Denies cough, dyspnea or wheezes Cardiovascular: Denies palpitation, chest discomfort or lower extremity swelling Gastrointestinal:  Denies nausea, heartburn or change in bowel habits Skin: Denies abnormal skin rashes Lymphatics: Denies new lymphadenopathy or easy bruising Neurological:Denies numbness, tingling or new weaknesses Behavioral/Psych: Mood is stable, no new changes  All other systems were reviewed with the patient and are negative.  PHYSICAL EXAMINATION:   Vitals:   09/13/23 0908  BP: 129/68   Pulse: 70  Resp: 16  Temp: 97.9 F (36.6 C)  SpO2: 100%    GENERAL:alert, no distress and comfortable SKIN: skin color, texture, turgor are normal, no rashes or significant lesions EYES: normal, Conjunctiva are pale and non-injected, sclera clear LUNGS: clear to auscultation and percussion with normal breathing effort HEART: regular rate & rhythm and no murmurs and no lower extremity edema ABDOMEN:abdomen soft, non-tender and normal bowel sounds Musculoskeletal:no cyanosis of digits and no clubbing  NEURO: alert & oriented x 3 with fluent speech  LABORATORY DATA:  I have reviewed the data as listed  Lab Results  Component Value Date   WBC 3.4 (L) 09/07/2023   NEUTROABS 1.6 (L) 09/07/2023   HGB 10.4 (L) 09/07/2023   HCT 32.7 (L) 09/07/2023   MCV 87.9 09/07/2023   PLT 182 09/07/2023      Component Value Date/Time   NA 139 09/07/2023 1247   K 3.7 09/07/2023 1247   CL 108 09/07/2023 1247   CO2 25 09/07/2023 1247   GLUCOSE 68 (L) 09/07/2023 1247   BUN 13 09/07/2023 1247   CREATININE 1.09 09/07/2023 1247   CALCIUM 8.8 (L) 09/07/2023 1247   PROT 8.2 (H) 09/07/2023 1247   ALBUMIN 3.5 09/07/2023 1247   AST 48 (H) 09/07/2023 1247   ALT 27 09/07/2023 1247   ALKPHOS 53 09/07/2023 1247   BILITOT 0.6 09/07/2023 1247   GFRNONAA >60 09/07/2023 1247   GFRAA >60 08/01/2017 2210      Chemistry      Component Value Date/Time   NA 139 09/07/2023 1247   K 3.7 09/07/2023 1247   CL 108 09/07/2023 1247   CO2 25 09/07/2023 1247   BUN 13 09/07/2023 1247   CREATININE 1.09 09/07/2023 1247      Component Value Date/Time   CALCIUM 8.8 (L) 09/07/2023 1247   ALKPHOS 53 09/07/2023 1247   AST 48 (H) 09/07/2023 1247   ALT 27 09/07/2023 1247   BILITOT 0.6 09/07/2023 1247     Lab Results  Component Value Date   IRON 26 (L) 09/07/2023   TIBC 410 09/07/2023   FERRITIN 8 (L) 09/07/2023     ASSESSMENT & PLAN:  Patient is a 72 year old male with past medical history of hepatitis C,  heart failure with reduced ejection fraction, COPD presenting for iron deficiency anemia  Iron deficiency anemia due to chronic blood loss -Likely secondary to chronic blood loss with a history of melena -No symptoms of fatigue, shortness of breath, abdominal pain, or blood in stools. Patient was previously on iron pills but ran out. No constipation reported with iron pills. -Resume iron pills every other day. -Administer IV iron in two doses starting next week. We discussed some of the risks, benefits, and alternatives of intravenous iron infusions. The patient is symptomatic from anemia and the iron level is critically low. Will  consider IV Iron to achieve higher levels of iron faster for adequate hematopoesis. Some of the side-effects to be expected including risks of infusion reactions, phlebitis, headaches, nausea and fatigue.  The patient is willing to proceed. Patient education material was dispensed. Goal is to keep ferritin level greater than 50 and resolution of anemia -Follows with GI and is planned for capsule endoscopy soon  RTC in 1 month with labs  Chronic hepatitis C without hepatic coma (HCC) -Patient has a history of chronic hepatitis C -Followed by GI and is awaiting to start treatment  Leukopenia -Likely secondary to hepatitis C and iron deficiency -Continue to monitor for now  Smoking Patient reports smoking half a pack per day and consuming three beers per day for the past 20 years. -Recommend cessation of smoking.  Risk of of smoking discussed in detail including inflammation, heart disease and incidence of cancer   Orders Placed This Encounter  Procedures   CBC with Differential    Standing Status:   Future    Standing Expiration Date:   09/12/2024   Ferritin    Standing Status:   Future    Standing Expiration Date:   09/12/2024   Vitamin B12    Standing Status:   Future    Standing Expiration Date:   09/12/2024   Folate    Standing Status:   Future     Standing Expiration Date:   09/12/2024   Iron and TIBC ([facility] ONLY)    Standing Status:   Future    Standing Expiration Date:   09/12/2024    The total time spent in the appointment was 30 minutes encounter with patients including review of chart and various tests results, discussions about plan of care and coordination of care plan   All questions were answered. The patient knows to call the clinic with any problems, questions or concerns. No barriers to learning was detected.   Cindie Crumbly, MD 10/15/202410:29 AM

## 2023-09-13 NOTE — Addendum Note (Signed)
Addended byCindie Crumbly on: 09/13/2023 11:02 AM   Modules accepted: Orders

## 2023-09-13 NOTE — Assessment & Plan Note (Signed)
-  Likely secondary to hepatitis C and iron deficiency -Continue to monitor for now

## 2023-09-13 NOTE — Assessment & Plan Note (Signed)
-  Patient has a history of chronic hepatitis C -Followed by GI and is awaiting to start treatment

## 2023-09-13 NOTE — Assessment & Plan Note (Signed)
Patient reports smoking half a pack per day and consuming three beers per day for the past 20 years. -Recommend cessation of smoking.  Risk of of smoking discussed in detail including inflammation, heart disease and incidence of cancer

## 2023-09-15 ENCOUNTER — Ambulatory Visit (HOSPITAL_COMMUNITY): Payer: 59 | Attending: Gastroenterology

## 2023-09-16 ENCOUNTER — Inpatient Hospital Stay: Payer: 59

## 2023-09-16 VITALS — BP 131/63 | HR 64 | Temp 97.9°F | Resp 16

## 2023-09-16 DIAGNOSIS — F17210 Nicotine dependence, cigarettes, uncomplicated: Secondary | ICD-10-CM | POA: Diagnosis not present

## 2023-09-16 DIAGNOSIS — D500 Iron deficiency anemia secondary to blood loss (chronic): Secondary | ICD-10-CM

## 2023-09-16 DIAGNOSIS — D72819 Decreased white blood cell count, unspecified: Secondary | ICD-10-CM | POA: Diagnosis not present

## 2023-09-16 MED ORDER — SODIUM CHLORIDE 0.9 % IV SOLN
510.0000 mg | Freq: Once | INTRAVENOUS | Status: AC
  Administered 2023-09-16: 510 mg via INTRAVENOUS
  Filled 2023-09-16: qty 510

## 2023-09-16 MED ORDER — ACETAMINOPHEN 325 MG PO TABS
650.0000 mg | ORAL_TABLET | Freq: Once | ORAL | Status: AC
  Administered 2023-09-16: 650 mg via ORAL
  Filled 2023-09-16: qty 2

## 2023-09-16 MED ORDER — CETIRIZINE HCL 10 MG PO TABS
10.0000 mg | ORAL_TABLET | Freq: Once | ORAL | Status: AC
  Administered 2023-09-16: 10 mg via ORAL
  Filled 2023-09-16: qty 1

## 2023-09-16 MED ORDER — SODIUM CHLORIDE 0.9 % IV SOLN: INTRAVENOUS | Status: DC

## 2023-09-16 NOTE — Patient Instructions (Signed)
[facility]  Discharge Instructions: Thank you for choosing [facility] to provide your oncology and hematology care.  If you have a lab appointment with the Cancer Center - please note that after April 8th, 2024, all labs will be drawn in the cancer center.  You do not have to check in or register with the main entrance as you have in the past but will complete your check-in in the cancer center.  Wear comfortable clothing and clothing appropriate for easy access to any Portacath or PICC line.   We strive to give you quality time with your provider. You may need to reschedule your appointment if you arrive late (15 or more minutes).  Arriving late affects you and other patients whose appointments are after yours.  Also, if you miss three or more appointments without notifying the office, you may be dismissed from the clinic at the provider's discretion.      For prescription refill requests, have your pharmacy contact our office and allow 72 hours for refills to be completed.    Today you received the following Feraheme infusion.  Ferumoxytol Injection What is this medication? FERUMOXYTOL (FER ue MOX i tol) treats low levels of iron in your body (iron deficiency anemia). Iron is a mineral that plays an important role in making red blood cells, which carry oxygen from your lungs to the rest of your body. This medicine may be used for other purposes; ask your health care provider or pharmacist if you have questions. COMMON BRAND NAME(S): Feraheme What should I tell my care team before I take this medication? They need to know if you have any of these conditions: Anemia not caused by low iron levels High levels of iron in the blood Magnetic resonance imaging (MRI) test scheduled An unusual or allergic reaction to iron, other medications, foods, dyes, or preservatives Pregnant or trying to get pregnant Breastfeeding How should I use this medication? This  medication is injected into a vein. It is given by your care team in a hospital or clinic setting. Talk to your care team the use of this medication in children. Special care may be needed. Overdosage: If you think you have taken too much of this medicine contact a poison control center or emergency room at once. NOTE: This medicine is only for you. Do not share this medicine with others. What if I miss a dose? It is important not to miss your dose. Call your care team if you are unable to keep an appointment. What may interact with this medication? Other iron products This list may not describe all possible interactions. Give your health care provider a list of all the medicines, herbs, non-prescription drugs, or dietary supplements you use. Also tell them if you smoke, drink alcohol, or use illegal drugs. Some items may interact with your medicine. What should I watch for while using this medication? Visit your care team regularly. Tell your care team if your symptoms do not start to get better or if they get worse. You may need blood work done while you are taking this medication. You may need to follow a special diet. Talk to your care team. Foods that contain iron include: whole grains/cereals, dried fruits, beans, or peas, leafy green vegetables, and organ meats (liver, kidney). What side effects may I notice from receiving this medication? Side effects that you should report to your care team as soon as possible: Allergic reactions--skin rash, itching, hives, swelling of the  face, lips, tongue, or throat Low blood pressure--dizziness, feeling faint or lightheaded, blurry vision Shortness of breath Side effects that usually do not require medical attention (report to your care team if they continue or are bothersome): Flushing Headache Joint pain Muscle pain Nausea Pain, redness, or irritation at injection site This list may not describe all possible side effects. Call your doctor for  medical advice about side effects. You may report side effects to FDA at 1-800-FDA-1088. Where should I keep my medication? This medication is given in a hospital or clinic. It will not be stored at home. NOTE: This sheet is a summary. It may not cover all possible information. If you have questions about this medicine, talk to your doctor, pharmacist, or health care provider.  2024 Elsevier/Gold Standard (2023-04-22 00:00:00)   To help prevent nausea and vomiting after your treatment, we encourage you to take your nausea medication as directed.  BELOW ARE SYMPTOMS THAT SHOULD BE REPORTED IMMEDIATELY: *FEVER GREATER THAN 100.4 F (38 C) OR HIGHER *CHILLS OR SWEATING *NAUSEA AND VOMITING THAT IS NOT CONTROLLED WITH YOUR NAUSEA MEDICATION *UNUSUAL SHORTNESS OF BREATH *UNUSUAL BRUISING OR BLEEDING *URINARY PROBLEMS (pain or burning when urinating, or frequent urination) *BOWEL PROBLEMS (unusual diarrhea, constipation, pain near the anus) TENDERNESS IN MOUTH AND THROAT WITH OR WITHOUT PRESENCE OF ULCERS (sore throat, sores in mouth, or a toothache) UNUSUAL RASH, SWELLING OR PAIN  UNUSUAL VAGINAL DISCHARGE OR ITCHING   Items with * indicate a potential emergency and should be followed up as soon as possible or go to the Emergency Department if any problems should occur.  Please show the CHEMOTHERAPY ALERT CARD or IMMUNOTHERAPY ALERT CARD at check-in to the Emergency Department and triage nurse.  Should you have questions after your visit or need to cancel or reschedule your appointment, please contact [facility] [phone]  and follow the prompts.  Office hours are 8:00 a.m. to 4:30 p.m. Monday - Friday. Please note that voicemails left after 4:00 p.m. may not be returned until the following business day.  We are closed weekends and major holidays. You have access to a nurse at all times for urgent questions. Please call the main number to the clinic [phone] and  follow the prompts.  For any non-urgent questions, you may also contact your provider using MyChart. We now offer e-Visits for anyone 60 and older to request care online for non-urgent symptoms. For details visit [email or web address].   Also download the MyChart app! Go to the app store, search "MyChart", open the app, select [place], and log in with your MyChart username and password.

## 2023-09-16 NOTE — Progress Notes (Signed)
Patient presents today for iron infusion.  Patient is in satisfactory condition with no new complaints voiced.  Vital signs are stable.  We will proceed with infusion per provider orders.   Patient tolerated treatment well with no complaints voiced.  Patient left ambulatory in stable condition.  Vital signs stable at discharge.  Follow up as scheduled.    

## 2023-09-26 DIAGNOSIS — Z8719 Personal history of other diseases of the digestive system: Secondary | ICD-10-CM | POA: Diagnosis not present

## 2023-09-28 ENCOUNTER — Inpatient Hospital Stay: Payer: 59

## 2023-10-03 ENCOUNTER — Inpatient Hospital Stay: Payer: 59

## 2023-10-03 DIAGNOSIS — Z681 Body mass index (BMI) 19 or less, adult: Secondary | ICD-10-CM | POA: Diagnosis not present

## 2023-10-03 DIAGNOSIS — I429 Cardiomyopathy, unspecified: Secondary | ICD-10-CM | POA: Diagnosis not present

## 2023-10-03 DIAGNOSIS — Z8719 Personal history of other diseases of the digestive system: Secondary | ICD-10-CM | POA: Diagnosis not present

## 2023-10-03 DIAGNOSIS — F17200 Nicotine dependence, unspecified, uncomplicated: Secondary | ICD-10-CM | POA: Diagnosis not present

## 2023-10-03 DIAGNOSIS — R030 Elevated blood-pressure reading, without diagnosis of hypertension: Secondary | ICD-10-CM | POA: Diagnosis not present

## 2023-10-03 DIAGNOSIS — I739 Peripheral vascular disease, unspecified: Secondary | ICD-10-CM | POA: Diagnosis not present

## 2023-10-03 DIAGNOSIS — Z1389 Encounter for screening for other disorder: Secondary | ICD-10-CM | POA: Diagnosis not present

## 2023-10-06 ENCOUNTER — Encounter (HOSPITAL_COMMUNITY)
Admission: RE | Disposition: A | Discharge status: Home / Self Care | Payer: Self-pay | Source: Home / Self Care | Attending: Internal Medicine

## 2023-10-06 ENCOUNTER — Ambulatory Visit (HOSPITAL_COMMUNITY)
Admission: RE | Admit: 2023-10-06 | Discharge: 2023-10-06 | Disposition: A | Discharge status: Home / Self Care | Payer: 59 | Attending: Internal Medicine | Admitting: Internal Medicine

## 2023-10-06 ENCOUNTER — Inpatient Hospital Stay: Payer: 59

## 2023-10-06 DIAGNOSIS — D509 Iron deficiency anemia, unspecified: Secondary | ICD-10-CM | POA: Diagnosis not present

## 2023-10-06 HISTORY — PX: GIVENS CAPSULE STUDY: SHX5432

## 2023-10-06 SURGERY — IMAGING PROCEDURE, GI TRACT, INTRALUMINAL, VIA CAPSULE

## 2023-10-06 NOTE — H&P (Signed)
Patient presenting for capsule endoscopy due to history of anemia

## 2023-10-10 ENCOUNTER — Encounter (HOSPITAL_COMMUNITY): Payer: Self-pay | Admitting: Internal Medicine

## 2023-10-10 ENCOUNTER — Inpatient Hospital Stay: Payer: 59

## 2023-10-11 NOTE — Op Note (Addendum)
Small Bowel Givens Capsule Study Procedure date:  10/06/23  Referring Provider:  Ermalinda Memos, PA-C PCP:  Dr. Donetta Potts, MD  Indication for procedure:  IDA, acute symptomatic anemia in May 2024 with Hgb down to 4.4 with reported melena. EGD and colonoscopy 5/27 with normal exams aside from grade 1 hemorrhoids. Completing Givens to complete GI work-up.  Patient data:  Wt: 58.97 kg Ht: 5\' 9"    Findings:  Single gastric angiectasia at  00:03:08 and few gastric petechiae at  00:42:31.  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 Difficult to determine actual number as it is unclear if some of these are the same lesion related to capsule going back and forth.  Benign-appearing lymphangiectasia at 01:37:26.  No other significant lesions, masses, or polyps noted. No overt bleeding or evidence of prior bleeding. Study was complete to the cecum.    First Gastric image:  00:01:30 First Duodenal image: 00:42:34 First Cecal image: 06:39:52 Gastric Passage time: 0h 62m Small Bowel Passage time:  5h 72m  Summary -Single gastric angiectasia. -Few gastric petechiae. -Numerous small bowel angiodysplastic lesions primarily concentrated in the proximal to mid small bowel, but also with a few in the distal small bowel. - IDA and prior melena likely secondary bleeding from small bowel angioectasias in the setting of prior intermittent BC powder and aspirin use (now discontinued).   Recommendations: As patient has not had any overt GI bleeding since May and Hgb has been improving with iron supplementation, ok to hold off on further GI intervention. If Hgb were to trend down, would need to consider referral to tertiary care for double-balloon enteroscopy as angiodysplastic lesions are throughout his entire small bowel. Strict NSAID avoidance. Continue following with hematology for IV iron as needed. Needs to  reschedule recently missed appointment.  Follow-up in our office in 8 weeks.   Ermalinda Memos, PA-C [facility]   Images:

## 2023-10-16 ENCOUNTER — Telehealth: Payer: Self-pay | Admitting: Gastroenterology

## 2023-10-16 DIAGNOSIS — K3189 Other diseases of stomach and duodenum: Secondary | ICD-10-CM | POA: Diagnosis not present

## 2023-10-16 DIAGNOSIS — I890 Lymphedema, not elsewhere classified: Secondary | ICD-10-CM

## 2023-10-16 DIAGNOSIS — K921 Melena: Secondary | ICD-10-CM | POA: Diagnosis not present

## 2023-10-16 DIAGNOSIS — K31811 Angiodysplasia of stomach and duodenum with bleeding: Secondary | ICD-10-CM | POA: Diagnosis not present

## 2023-10-16 DIAGNOSIS — D509 Iron deficiency anemia, unspecified: Secondary | ICD-10-CM

## 2023-10-16 NOTE — Telephone Encounter (Signed)
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 -Few gastric petechiae. -Numerous small bowel angioectasias primarily concentrated in the proximal to mid small bowel, but also with a few in the distal small bowel. - IDA and prior melena likely secondary bleeding from small bowel angioectasias in the setting of prior intermittent BC powder and aspirin use (now discontinued).   Recommendations: As patient has not had any overt GI bleeding since May and Hgb has been improving with iron supplementation, ok to hold off on further GI intervention. If Hgb were to trend down, would need to consider referral to tertiary care for double-balloon enteroscopy as angiodysplastic lesions are throughout his entire small bowel. Strict NSAID avoidance. Continue following with hematology for IV iron as needed. Needs to reschedule recently missed appointment.  Follow-up in our office in 8 weeks.     Mandy: Please arrange 8 week follow-up.

## 2023-10-17 NOTE — Progress Notes (Unsigned)
[facility] [place]  ASSESSMENT / PLAN: Xavier Martinez is a 72 y.o. male with atherosclerosis of native arteries of bilateral lower extremities causing no symptoms.  Patient counseled patients with asymptomatic peripheral arterial disease or claudication have a 1-2% risk of developing chronic limb threatening ischemia, but a 15-30% risk of mortality in the next 5 years. Intervention should only be considered for medically optimized patients with disabling symptoms.   Recommend:  Abstinence from all tobacco products. Blood glucose control with goal A1c < 7%. Blood pressure control with goal blood pressure < 140/90 mmHg. Lipid reduction therapy with goal LDL-C <100 mg/dL  Aspirin 81mg  PO QD.  Atorvastatin 40-80mg  PO QD (or other "high intensity" statin therapy).  Follow up in 1 year with ABI  CHIEF COMPLAINT: Abnormal home health screening  HISTORY OF PRESENT ILLNESS: Xavier Martinez is a 72 y.o. male referred to clinic for evaluation of abnormal.  These were ordered because a home health screening performed by his insurance company showed abnormal circulation in his feet.  The patient is asymptomatic from a lower extremity standpoint.  He reports a palpable "knot" in his right anterior calf.  He reports occasional shooting pain in his right leg which rapidly resolves with walking.  He does not have cramping pain when walking.  He does not have pain in his feet at rest.  He does not have any ulcers about his feet.   Past Medical History:  Diagnosis Date   Alcohol use    Anemia    Cocaine use    COPD (chronic obstructive pulmonary disease) (HCC)    GERD (gastroesophageal reflux disease)    Nonischemic cardiomyopathy (HCC)    Tobacco use     Past Surgical History:  Procedure Laterality Date   GIVENS CAPSULE STUDY N/A 10/06/2023   Procedure: GIVENS CAPSULE STUDY;  Surgeon: Lanelle Bal, DO;  [facility];  Service: Endoscopy;  Laterality: N/A;   730am   MANDIBLE FRACTURE SURGERY Bilateral 2013   TENDON REPAIR Left 08/11/2017   Procedure: TENDON REPAIR WITH GRAFT AND TENDON TRANSFER;  Surgeon: Betha Loa, MD;  Location: [facility];  Service: Orthopedics;  Laterality: Left;   WOUND EXPLORATION Left 08/11/2017   Procedure: LEFT ARM WOUND EXPLORATION;  Surgeon: Betha Loa, MD;  Location: [facility];  Service: Orthopedics;  Laterality: Left;   WRIST ARTHROSCOPY Right 1987    Family History  Problem Relation Age of Onset   Heart disease Mother     Social History   Socioeconomic History   Marital status: Single    Spouse name: Not on file   Number of children: Not on file   Years of education: Not on file   Highest education level: Not on file  Occupational History   Not on file  Tobacco Use   Smoking status: Every Day    Current packs/day: 0.50    Average packs/day: 0.5 packs/day for 25.0 years (12.5 ttl pk-yrs)    Types: Cigarettes   Smokeless tobacco: Never  Vaping Use   Vaping status: Never Used  Substance and Sexual Activity   Alcohol use: Yes    Alcohol/week: 10.0 standard drinks of alcohol    Types: 10 Cans of beer per week    Comment: 2 beer daily.   Drug use: Not Currently    Types: "Crack" cocaine    Comment: Reports last use was around August 2024.   Sexual activity: Not on file  Other  Topics Concern   Not on file  Social History Narrative   Not on file   Social Determinants of Health   Financial Resource Strain: Medium Risk (07/18/2022)   [facility]   Overall Financial Resource Strain (CARDIA)    Difficulty of Paying Living Expenses: Somewhat hard  Food Insecurity: Low Risk  (04/22/2023)   [facility]   Hunger Vital Sign    Worried About Running Out of Food in the Last Year: Never true    Ran Out of Food in the Last Year: Never true  Transportation Needs: Unmet Transportation Needs (04/22/2023)   [facility]   Transportation    In the past 12 months, has lack of reliable transportation kept you from medical appointments, meetings, work or from getting things needed for daily living? : Yes  Physical Activity: Not on file  Stress: No Stress Concern Present (07/18/2022)   Received from [facility], [facility] - Occupational Stress Questionnaire    Feeling of Stress : Not at all  Social Connections: Unknown (04/13/2022)   [facility]   Social Network    Social Network: Not on file  Intimate Partner Violence: Not At Risk (09/13/2023)   Humiliation, Afraid, Rape, and Kick questionnaire    Fear of Current or Ex-Partner: No    Emotionally Abused: No    Physically Abused: No    Sexually Abused: No    No Known Allergies  Current Outpatient Medications  Medication Sig Dispense Refill   albuterol (VENTOLIN HFA) 108 (90 Base) MCG/ACT inhaler Inhale 2 puffs into the lungs every 6 (six) hours as needed for wheezing. 6.7 g 0   atorvastatin (LIPITOR) 80 MG tablet Take 1 tablet (80 mg total) by mouth daily. 90 tablet 3   carvedilol (COREG) 3.125 MG tablet Take 1 tablet (3.125 mg total) by mouth 2 (two) times daily with a meal. 180 tablet 3   Cholecalciferol (VITAMIN D3) 50 MCG (2000 UT) capsule Take by mouth.     ferrous sulfate 325 (65 FE) MG tablet Take 1 tablet (325 mg total) by mouth every other day. 30 tablet 3   furosemide (LASIX) 20 MG tablet Take 1 tablet (20 mg total) by mouth daily. 90 tablet 3   losartan (COZAAR) 25 MG tablet Take 1 tablet (25 mg total) by mouth daily. 90 tablet 1   nitroGLYCERIN (NITROSTAT) 0.4 MG SL tablet Place 1 tablet (0.4 mg total) under the tongue every 5 (five) minutes x 3 doses as needed for chest pain (if no relief after 3rd dose proceed to ED or call 911). 25 tablet 1   pantoprazole (PROTONIX) 40 MG tablet Take 1 tablet (40 mg total) by mouth daily. 90 tablet  3   No current facility-administered medications for this visit.    PHYSICAL EXAM Vitals:   10/18/23 1311  BP: 129/64  Pulse: 77  SpO2: 96%  Weight: 129 lb (58.5 kg)  Height: 5\' 9"  (1.753 m)    Thin elderly man in no distress  regular rate and rhythm Unlabored breathing Right posterior tibial pulse palpable Left dorsalis pedis pulse palpable  PERTINENT LABORATORY AND RADIOLOGIC DATA  Most recent CBC    Latest Ref Rng & Units 09/07/2023   12:47 PM 08/01/2017   10:10 PM  CBC  WBC 4.0 - 10.5 K/uL 3.4  4.0   Hemoglobin 13.0 - 17.0 g/dL  10.4  12.3   Hematocrit 39.0 - 52.0 % 32.7  36.0   Platelets 150 - 400 K/uL 182  147      Most recent CMP    Latest Ref Rng & Units 09/07/2023   12:47 PM 06/16/2023   10:14 AM 08/01/2017   10:10 PM  CMP  Glucose 70 - 99 mg/dL 68  93  84   BUN 8 - 23 mg/dL 13  19  7    Creatinine 0.61 - 1.24 mg/dL 1.47  8.29  5.62   Sodium 135 - 145 mmol/L 139  138  138   Potassium 3.5 - 5.1 mmol/L 3.7  4.0  3.8   Chloride 98 - 111 mmol/L 108  109  111   CO2 22 - 32 mmol/L 25  25  18    Calcium 8.9 - 10.3 mg/dL 8.8  8.8  8.9   Total Protein 6.5 - 8.1 g/dL 8.2     Total Bilirubin 0.3 - 1.2 mg/dL 0.6     Alkaline Phos 38 - 126 U/L 53     AST 15 - 41 U/L 48     ALT 0 - 44 U/L 27      EXAM:  NONINVASIVE PHYSIOLOGIC VASCULAR STUDY OF BILATERAL LOWER  EXTREMITIES   TECHNIQUE:  Non-invasive vascular evaluation of both lower extremities was  performed at rest, including calculation of ankle-brachial indices,  multiple segmental pressure evaluation, segmental Doppler and  segmental pulse volume recording.   COMPARISON:  None   FINDINGS:  Right:   Resting ankle brachial index:  0.92   Segmental blood pressure: Upper extremity pressures are symmetric   Doppler: Segmental Doppler at the right ankle demonstrates  monophasic posterior tibial artery waveform and triphasic dorsalis  pedis.   Pulse volume recording: Segmental PVR maintained at the ankle    Left:   Resting ankle brachial index: 1.08   Segmental blood pressure: Upper extremity pressure symmetric.   Doppler: Segmental Doppler at the left ankle demonstrates  multiphasic waveforms.   Pulse volume recording: Segmental PVR maintained   Additional:   IMPRESSION:  Right:   Resting ABI the right lower extremity is borderline, though remains  within normal limits.   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   Left:   Resting ABI within normal limits.   Segmental exam at the left ankle demonstrates waveforms maintained.   Signed,   Yvone Neu. Miachel Roux, RPVI   [facility]   [facility]    Rande Brunt. Lenell Antu, MD FACS [facility] Phone Number: [phone] 10/18/2023 2:06 PM   Total time spent on preparing this encounter including chart review, data review, collecting history, examining the patient, coordinating care for this new patient, 45 minutes.  Portions of this report may have been transcribed using voice recognition software.  Every effort has been made to ensure accuracy; however, inadvertent computerized transcription errors may still be present.

## 2023-10-18 ENCOUNTER — Ambulatory Visit (INDEPENDENT_AMBULATORY_CARE_PROVIDER_SITE_OTHER): Payer: 59 | Admitting: Vascular Surgery

## 2023-10-18 ENCOUNTER — Encounter: Payer: Self-pay | Admitting: Vascular Surgery

## 2023-10-18 VITALS — BP 129/64 | HR 77 | Ht 69.0 in | Wt 129.0 lb

## 2023-10-18 DIAGNOSIS — I739 Peripheral vascular disease, unspecified: Secondary | ICD-10-CM | POA: Diagnosis not present

## 2023-10-18 NOTE — Telephone Encounter (Signed)
LMOM for pt to call office  

## 2023-10-19 NOTE — Telephone Encounter (Signed)
LMOM for pt to call office  

## 2023-10-20 NOTE — Telephone Encounter (Signed)
Spoke to pt, informed him of results and recommendations. Pt voiced understanding.  

## 2023-10-21 ENCOUNTER — Other Ambulatory Visit: Payer: Self-pay

## 2023-10-21 DIAGNOSIS — I739 Peripheral vascular disease, unspecified: Secondary | ICD-10-CM

## 2023-10-26 ENCOUNTER — Inpatient Hospital Stay: Payer: 59 | Attending: Hematology

## 2023-10-26 ENCOUNTER — Other Ambulatory Visit: Payer: 59

## 2023-10-26 VITALS — BP 120/64 | HR 65 | Temp 97.5°F | Resp 18

## 2023-10-26 DIAGNOSIS — D509 Iron deficiency anemia, unspecified: Secondary | ICD-10-CM | POA: Diagnosis not present

## 2023-10-26 DIAGNOSIS — D500 Iron deficiency anemia secondary to blood loss (chronic): Secondary | ICD-10-CM

## 2023-10-26 MED ORDER — SODIUM CHLORIDE 0.9 % IV SOLN
510.0000 mg | Freq: Once | INTRAVENOUS | Status: AC
  Administered 2023-10-26: 510 mg via INTRAVENOUS
  Filled 2023-10-26: qty 510

## 2023-10-26 MED ORDER — SODIUM CHLORIDE 0.9 % IV SOLN: INTRAVENOUS | Status: DC

## 2023-10-26 MED ORDER — ACETAMINOPHEN 325 MG PO TABS
650.0000 mg | ORAL_TABLET | Freq: Once | ORAL | Status: AC
Start: 2023-10-26 — End: 2023-10-26
  Administered 2023-10-26: 650 mg via ORAL
  Filled 2023-10-26: qty 2

## 2023-10-26 MED ORDER — CETIRIZINE HCL 10 MG PO TABS
10.0000 mg | ORAL_TABLET | Freq: Once | ORAL | Status: AC
  Administered 2023-10-26: 10 mg via ORAL
  Filled 2023-10-26: qty 1

## 2023-10-26 NOTE — Progress Notes (Signed)
Patient presents today for Feraheme infusion per providers order.  Vital signs WNL.  Patient has no new complaints at this time.  Peripheral IV started and blood return noted pre and post infusion.  Stable during infusion without adverse affects.  Vital signs stable.  No complaints at this time.  Discharge from clinic ambulatory in stable condition.  Alert and oriented X 3.  Follow up with [facility] as scheduled.

## 2023-11-02 ENCOUNTER — Inpatient Hospital Stay: Payer: 59 | Admitting: Oncology

## 2023-11-07 DIAGNOSIS — Z23 Encounter for immunization: Secondary | ICD-10-CM | POA: Diagnosis not present

## 2023-11-26 ENCOUNTER — Other Ambulatory Visit: Payer: Self-pay | Admitting: Cardiology

## 2023-12-07 ENCOUNTER — Inpatient Hospital Stay: Payer: 59 | Attending: Hematology

## 2023-12-07 DIAGNOSIS — D509 Iron deficiency anemia, unspecified: Secondary | ICD-10-CM | POA: Insufficient documentation

## 2023-12-07 DIAGNOSIS — D500 Iron deficiency anemia secondary to blood loss (chronic): Secondary | ICD-10-CM

## 2023-12-07 LAB — CBC WITH DIFFERENTIAL/PLATELET
Abs Immature Granulocytes: 0.01 10*3/uL (ref 0.00–0.07)
Basophils Absolute: 0 10*3/uL (ref 0.0–0.1)
Basophils Relative: 1 %
Eosinophils Absolute: 0.3 10*3/uL (ref 0.0–0.5)
Eosinophils Relative: 8 %
HCT: 35.6 % — ABNORMAL LOW (ref 39.0–52.0)
Hemoglobin: 11.8 g/dL — ABNORMAL LOW (ref 13.0–17.0)
Immature Granulocytes: 0 %
Lymphocytes Relative: 30 %
Lymphs Abs: 1.3 10*3/uL (ref 0.7–4.0)
MCH: 34.6 pg — ABNORMAL HIGH (ref 26.0–34.0)
MCHC: 33.1 g/dL (ref 30.0–36.0)
MCV: 104.4 fL — ABNORMAL HIGH (ref 80.0–100.0)
Monocytes Absolute: 0.6 10*3/uL (ref 0.1–1.0)
Monocytes Relative: 14 %
Neutro Abs: 2 10*3/uL (ref 1.7–7.7)
Neutrophils Relative %: 47 %
Platelets: 159 10*3/uL (ref 150–400)
RBC: 3.41 MIL/uL — ABNORMAL LOW (ref 4.22–5.81)
RDW: 14.6 % (ref 11.5–15.5)
WBC: 4.2 10*3/uL (ref 4.0–10.5)
nRBC: 0 % (ref 0.0–0.2)

## 2023-12-07 LAB — IRON AND TIBC
Iron: 98 ug/dL (ref 45–182)
Saturation Ratios: 33 % (ref 17.9–39.5)
TIBC: 300 ug/dL (ref 250–450)
UIBC: 202 ug/dL

## 2023-12-07 LAB — FOLATE: Folate: 7.6 ng/mL (ref >5.9)

## 2023-12-07 LAB — VITAMIN B12: Vitamin B-12: 460 pg/mL (ref 180–914)

## 2023-12-07 LAB — FERRITIN: Ferritin: 197 ng/mL (ref 24–336)

## 2023-12-08 ENCOUNTER — Other Ambulatory Visit: Payer: Self-pay | Admitting: Cardiology

## 2023-12-11 NOTE — Progress Notes (Signed)
 Referring Provider: Lauran Pollard, MD Primary Care Physician:  Lauran Pollard, MD Primary GI Physician: Dr. Mordechai April  Chief Complaint  Patient presents with   Follow-up    Follow up. No problems. Results to go over Given's capsule.     HPI:   Xavier Martinez is a 73 y.o. male with history of alcohol use, illicit drug use, COPD, nonischemic cardiomyopathy, GERD, chronic hep C, admitted in May 2024 with acute symptomatic anemia with reports of melena, presenting today for follow-up of anemia and chronic Hep C.   Prior EGD/colonoscopy 04/25/23 at [facility] with normal exams aside from grade 1 hemorrhoids.   Last seen in the office 09/05/23. He denied any GI symptoms. No GI bleeding. Reported diagnosis of Hep C in the 80s, but had never been treated. No symptoms of decompensated liver disease. Suspect patient likely contracted hep C from intranasal drug use. Reports he last used cocaine a couple months ago, stating he has quit. He was drinking a couple beers a day. Recommended Given's capsule, US  with elastography, pre-treatment labs for Hep C, advised patient to work towards alcohol cessation for Hep C treatment.   Labs completed 09/07/23:  Hgb 10.4 (L), WBC 3.4 (L), platelets 182, AST 48 (H), ALT 27, alk phos 53, T bili 0.6.  INR wnl HCV genotype 1a Immune to Hep A and B HIV nonreactive. Iron, saturation, and ferritin low.   US  elastography not completed.   Given's 10/06/23: -Single gastric angiectasia. -Few gastric petechiae. -Numerous small bowel angiodysplastic lesions primarily concentrated in the proximal to mid small bowel, but also with a few in the distal small bowel. - IDA and prior melena likely secondary bleeding from small bowel angioectasias in the setting of prior intermittent BC powder and aspirin  use (now discontinued).   Recommendations:  -  As patient had no recurrent overt GI bleeding and Hgb was improving, hold off on GI intervention. If  Hgb were to trend down, would need to consider referral to tertiary care for double-balloon enteroscopy.  - Strict NSAID avoidance.   Today: Anemia: Established with hematology. Has received IV iron, last on 10/26/23.  12/07/23: Hgb 11.8, iron panel, B12, and folate wnl.   Denies BRBPR or melena.  Stools are dark on oral iron.   Hep C:  Last cocaine use couple weeks ago. Using about twice a month.  Drinking about 3, 12 oz beer per day. Maybe a little more when football.   Denies abdominal distention, lower extremity edema, yellowing of eyes or skin, bruising, bleeding, mental status changes, abdominal pain, nausea, vomiting, GERD, dysphagia, constipation, diarrhea.   Past Medical History:  Diagnosis Date   Alcohol use    Anemia    Cocaine use    COPD (chronic obstructive pulmonary disease) (HCC)    GERD (gastroesophageal reflux disease)    Nonischemic cardiomyopathy (HCC)    Tobacco use     Past Surgical History:  Procedure Laterality Date   GIVENS CAPSULE STUDY N/A 10/06/2023   Procedure: GIVENS CAPSULE STUDY;  Surgeon: Vinetta Greening, DO;  [facility];  Service: Endoscopy;  Laterality: N/A;  730am   MANDIBLE FRACTURE SURGERY Bilateral 2013   TENDON REPAIR Left 08/11/2017   Procedure: TENDON REPAIR WITH GRAFT AND TENDON TRANSFER;  Surgeon: Brunilda Capra, MD;  Location: [facility];  Service: Orthopedics;  Laterality: Left;   WOUND EXPLORATION Left 08/11/2017   Procedure: LEFT ARM WOUND EXPLORATION;  Surgeon: Brunilda Capra, MD;  Location:  SURGERY CENTER;  Service: Orthopedics;  Laterality: Left;   WRIST ARTHROSCOPY Right 1987    Current Outpatient Medications  Medication Sig Dispense Refill   albuterol  (VENTOLIN  HFA) 108 (90 Base) MCG/ACT inhaler Inhale 2 puffs into the lungs every 6 (six) hours as needed for wheezing. 6.7 g 0   atorvastatin  (LIPITOR) 80 MG tablet Take 1 tablet (80 mg total) by mouth daily. 90 tablet 3   carvedilol  (COREG )  3.125 MG tablet Take 1 tablet (3.125 mg total) by mouth 2 (two) times daily with a meal. 180 tablet 3   Cholecalciferol (VITAMIN D3) 50 MCG (2000 UT) capsule Take by mouth.     ferrous sulfate  325 (65 FE) MG tablet Take 1 tablet (325 mg total) by mouth every other day. 30 tablet 3   furosemide  (LASIX ) 20 MG tablet Take 1 tablet (20 mg total) by mouth daily. 90 tablet 3   losartan  (COZAAR ) 25 MG tablet Take 1 tablet (25 mg total) by mouth daily. 90 tablet 1   nitroGLYCERIN  (NITROSTAT ) 0.4 MG SL tablet Place 1 tablet (0.4 mg total) under the tongue every 5 (five) minutes x 3 doses as needed for chest pain (if no relief after 3rd dose proceed to ED or call 911). 25 tablet 1   pantoprazole  (PROTONIX ) 40 MG tablet TAKE 1 TABLET BY MOUTH EVERY DAY 90 tablet 0   No current facility-administered medications for this visit.    Allergies as of 12/14/2023   (No Known Allergies)    Family History  Problem Relation Age of Onset   Heart disease Mother     Social History   Socioeconomic History   Marital status: Single    Spouse name: Not on file   Number of children: Not on file   Years of education: Not on file   Highest education level: Not on file  Occupational History   Not on file  Tobacco Use   Smoking status: Every Day    Current packs/day: 0.50    Average packs/day: 0.5 packs/day for 25.0 years (12.5 ttl pk-yrs)    Types: Cigarettes   Smokeless tobacco: Never  Vaping Use   Vaping status: Never Used  Substance and Sexual Activity   Alcohol use: Yes    Alcohol/week: 10.0 standard drinks of alcohol    Types: 10 Cans of beer per week    Comment: 2-3 beer daily.   Drug use: Yes    Types: "Crack" cocaine    Comment: Couple times a month   Sexual activity: Not on file  Other Topics Concern   Not on file  Social History Narrative   Not on file   Social Drivers of Health   Financial Resource Strain: Medium Risk (07/18/2022)   Received [facility]    Overall Financial Resource Strain (CARDIA)    Difficulty of Paying Living Expenses: Somewhat hard  Food Insecurity: Low Risk  (04/22/2023)   [facility]   Hunger Vital Sign    Worried About Running Out of Food in the Last Year: Never true    Ran Out of Food in the Last Year: Never true  Transportation Needs: Unmet Transportation Needs (04/22/2023)   [facility]   Transportation    In the past 12 months, has lack of reliable transportation kept you from medical appointments, meetings, work or from getting things needed for daily living? : Yes  Physical Activity: Not on file  Stress:  No Stress Concern Present (07/18/2022)   Received from [facility], [facility] Occupational Health - Occupational Stress Questionnaire    Feeling of Stress : Not at all  Social Connections: Unknown (04/13/2022)   Received from [facility], [facility]   Social Network    Social Network: Not on file    Review of Systems: Gen: Denies fever, chills, cold or flulike symptoms, presyncope, syncope. CV: Denies chest pain, palpitations. Resp: Denies dyspnea, cough. GI: See HPI Heme: See HPI  Physical Exam: BP 111/65 (BP Location: Right Arm, Patient Position: Sitting, Cuff Size: Normal)   Pulse 69   Temp 97.7 F (36.5 C) (Temporal)   Ht 5\' 9"  (1.753 m)   Wt 129 lb 3.2 oz (58.6 kg)   BMI 19.08 kg/m  General:   Alert and oriented. No distress noted. Pleasant and cooperative.  Head:  Normocephalic and atraumatic. Eyes:  Conjuctiva clear without scleral icterus. Heart:  S1, S2 present without murmurs appreciated. Lungs:  Clear to auscultation bilaterally. No wheezes, rales, or rhonchi. No distress.  Abdomen:  +BS, soft, non-tender and non-distended. No rebound or guarding. No HSM or masses noted. Msk:  Symmetrical without gross deformities. Normal posture. Extremities:  Without edema. Neurologic:  Alert and  oriented  x4 Psych:  Normal mood and affect.    Assessment:  73 y.o. male with history of alcohol use, illicit drug use, COPD, nonischemic cardiomyopathy, GERD, chronic hep C, admitted in May 2024 with acute symptomatic anemia with reports of melena, presenting today for follow-up of anemia and chronic Hep C.   Anemia:  Likely secondary to small bowel angiodysplastic lesions in the setting of BC powders aspirin .  Underwent EGD and colonoscopy 04/25/2023 at [facility] with normal exams aside from grade 1 hemorrhoids.  Givens capsule in November 2024 with small gastric angiectasia, few gastric petechiae, numerous small bowel angiodysplastic lesions primarily concentrated in the proximal and mid small bowel, but also with a few in the distal small bowel.  He has discontinued all NSAID use, is following with hematology, and has received IV iron. No overt GI bleeding though stools are dark on iron.  Most recent hemoglobin improved to 11.8.  Iron panel, B12, and folate within normal limits.  We will continue to monitor for now.  If hemoglobin were to decline, would need to consider referral to tertiary care for double-balloon enteroscopy.  Chronic hepatitis C: Patient reports diagnosis in the 80s, but never treated.  This is likely from intranasal drug use.  Currently using cocaine a couple times a month.  Hep C quant 98,200 and September 2024.  Genotype Ia.  Unclear if he has any degree of fibrosis/cirrhosis as he has no abdominal imaging on file.  Clinically, no symptoms of decompensated liver disease.  He is immune to hepatitis A and B.  Has slight elevation of AST that this may be secondary to daily alcohol use.  Platelets have been normal.  We discussed hep C treatment today.  I would like to get him started on treatment soon if possible, but advised that he needs to be abstinent from cocaine as well as alcohol. Patient states he should be able to do this no problem.  I will plan to see him back in  6 weeks to see how he is doing with this.    Plan:  Continue to follow with hematology for close monitoring of hemoglobin and for IV iron as needed. Avoid NSAIDs. Abdominal ultrasound with  elastography. Counseled on the importance of abstaining from alcohol and all illicit drug use. Follow-up in 6 weeks to consider hep C treatment.   Shana Daring, PA-C [facility] 12/14/2023

## 2023-12-14 ENCOUNTER — Ambulatory Visit (INDEPENDENT_AMBULATORY_CARE_PROVIDER_SITE_OTHER): Payer: 59 | Admitting: Gastroenterology

## 2023-12-14 ENCOUNTER — Inpatient Hospital Stay: Payer: 59 | Admitting: Oncology

## 2023-12-14 ENCOUNTER — Encounter: Payer: Self-pay | Admitting: Oncology

## 2023-12-14 ENCOUNTER — Encounter: Payer: Self-pay | Admitting: Gastroenterology

## 2023-12-14 VITALS — BP 111/65 | HR 69 | Temp 97.7°F | Ht 69.0 in | Wt 129.2 lb

## 2023-12-14 DIAGNOSIS — F1090 Alcohol use, unspecified, uncomplicated: Secondary | ICD-10-CM

## 2023-12-14 DIAGNOSIS — F1490 Cocaine use, unspecified, uncomplicated: Secondary | ICD-10-CM

## 2023-12-14 DIAGNOSIS — R7401 Elevation of levels of liver transaminase levels: Secondary | ICD-10-CM | POA: Diagnosis not present

## 2023-12-14 DIAGNOSIS — Z8719 Personal history of other diseases of the digestive system: Secondary | ICD-10-CM | POA: Diagnosis not present

## 2023-12-14 DIAGNOSIS — B182 Chronic viral hepatitis C: Secondary | ICD-10-CM

## 2023-12-14 DIAGNOSIS — D649 Anemia, unspecified: Secondary | ICD-10-CM | POA: Diagnosis not present

## 2023-12-14 NOTE — Patient Instructions (Signed)
 Continue following with hematology for close monitoring of her hemoglobin and IV iron as needed.  Avoid all NSAID products including ibuprofen, Aleve, Advil, BC powders, Goody's powders, and anything that says "NSAID" on the package.  We will get you scheduled for an ultrasound of your liver due to chronic hepatitis C.  You will need to abstain from all alcohol and illicit drug use in order to start hep C treatment.  I will plan to see you back in the office in 4 to 6 weeks from now to see how you are doing with this.  Shana Daring, PA-C [facility]

## 2023-12-16 ENCOUNTER — Encounter: Payer: Self-pay | Admitting: Oncology

## 2023-12-20 ENCOUNTER — Encounter: Payer: Self-pay | Admitting: Nurse Practitioner

## 2023-12-20 ENCOUNTER — Ambulatory Visit (HOSPITAL_COMMUNITY)
Admission: RE | Admit: 2023-12-20 | Discharge: 2023-12-20 | Disposition: A | Discharge status: Home / Self Care | Payer: 59 | Source: Ambulatory Visit | Attending: Gastroenterology | Admitting: Gastroenterology

## 2023-12-20 ENCOUNTER — Ambulatory Visit (INDEPENDENT_AMBULATORY_CARE_PROVIDER_SITE_OTHER): Payer: 59 | Admitting: Nurse Practitioner

## 2023-12-20 VITALS — BP 122/70 | HR 56 | Ht 69.0 in | Wt 131.0 lb

## 2023-12-20 DIAGNOSIS — I428 Other cardiomyopathies: Secondary | ICD-10-CM | POA: Insufficient documentation

## 2023-12-20 DIAGNOSIS — I340 Nonrheumatic mitral (valve) insufficiency: Secondary | ICD-10-CM

## 2023-12-20 DIAGNOSIS — I341 Nonrheumatic mitral (valve) prolapse: Secondary | ICD-10-CM

## 2023-12-20 DIAGNOSIS — F1010 Alcohol abuse, uncomplicated: Secondary | ICD-10-CM

## 2023-12-20 DIAGNOSIS — Z720 Tobacco use: Secondary | ICD-10-CM | POA: Diagnosis not present

## 2023-12-20 DIAGNOSIS — I5032 Chronic diastolic (congestive) heart failure: Secondary | ICD-10-CM | POA: Diagnosis not present

## 2023-12-20 DIAGNOSIS — B182 Chronic viral hepatitis C: Secondary | ICD-10-CM | POA: Insufficient documentation

## 2023-12-20 MED ORDER — LOSARTAN POTASSIUM 25 MG PO TABS: 25.0000 mg | ORAL_TABLET | Freq: Every day | ORAL | 1 refills | Status: DC

## 2023-12-20 NOTE — Progress Notes (Unsigned)
Cardiology Office Note:  .   Date: 12/20/2023 ID:  Xavier Martinez, DOB [birth year], MRN [id number] PCP: Xavier Potts, MD  [facility] Providers Cardiologist:  Xavier Dell, MD { History of Present Illness: .   Xavier Martinez is a 73 y.o. male with a PMH of HFimpEF/NICM, MR, hx of severe recurrent GI bleeding (no longer on ASA), hx of polysubstance abuse, who presents today for scheduled follow-up.   TTE 06/2022 EF 35-40%.   Last seen by Xavier Martinez on May 09, 2023. Prior to office visit, he had been recently hospitalized for severe blood loss anemia, UDS positive for cocaine. Underwent EGD and colonoscopy, had evidence of demand ischemia. Was eventually taken off aspirin. Was overall doing well from a cardiac perspective at OV with Xavier Martinez. Xavier Martinez updated Echo that showed recovered EF at 55-60%, mild mitral valve prolapse with mild MR.   06/17/2023 - Today he presents for follow-up. He states he is doing well. Denies any chest pain, shortness of breath, palpitations, syncope, presyncope, dizziness, orthopnea, PND, swelling or significant weight changes, acute bleeding, or claudication. Tolerating medications well. Has quit cocaine 1 month or so ago. I congratulated him. Now is seeing Xavier Martinez as his PCP.   12/20/2023 -he presents today for follow-up.  Says he is doing well from a cardiac perspective.  Currently has some chest cold symptoms, says his PCP is aware of this. Denies any chest pain, shortness of breath, palpitations, syncope, presyncope, dizziness, orthopnea, PND, swelling or significant weight changes, acute bleeding, or claudication.  Says he is continuing to work on weaning off his tobacco use as well as alcohol use, says he has been sober from cocaine since I last saw him.  SH: Smokes 5 cigarettes per day, trying to wean himself off nicotine, drinks around 2-3 beers per day.   Studies Reviewed: Marland Kitchen    EKG: EKG  Interpretation Date/Time:  Tuesday December 20 2023 13:10:16 EST Ventricular Rate:  61 PR Interval:  148 QRS Duration:  102 QT Interval:  442 QTC Calculation: 444 R Axis:   53  Text Interpretation: Sinus rhythm with Premature supraventricular complexes Left ventricular hypertrophy with repolarization abnormality ( Sokolow-Lyon ) No previous ECGs available Confirmed by Xavier Martinez [phone]) on 12/21/2023 1:09:39 PM   Echo 05/2023:  1. Left ventricular ejection fraction, by estimation, is 55 to 60%. The  left ventricle has normal function. The left ventricle has no regional  wall motion abnormalities. Left ventricular diastolic parameters are  indeterminate.   2. Right ventricular systolic function is normal. The right ventricular  size is normal. There is normal pulmonary artery systolic pressure.   3. Left atrial size was mild to moderately dilated.   4. Right atrial size was mildly dilated.   5. The mitral valve is abnormal. Anterior mitral valve leaflet appears to  have mild prolapse but M-mode does not show any evidence of mitral valve  prolapse. There are two MR jets, directed centrally. Mild mitral valve  regurgitation. No evidence of  mitral stenosis.   6. The aortic valve is tricuspid. Aortic valve regurgitation is not  visualized. No aortic stenosis is present.   7. The inferior vena cava is normal in size with greater than 50%  respiratory variability, suggesting right atrial pressure of 3 mmHg.   Comparison(s): No significant change from prior study.  Physical Exam:   VS:  BP 122/70   Pulse (!) 56   Ht 5\' 9"  (1.753 m)  Wt 131 lb (59.4 kg)   SpO2 95%   BMI 19.35 kg/m    Wt Readings from Last 3 Encounters:  12/20/23 131 lb (59.4 kg)  12/14/23 129 lb 3.2 oz (58.6 kg)  10/18/23 129 lb (58.5 kg)    GEN: Thin, 73 y.o. male in no acute distress NECK: No JVD; No carotid bruits CARDIAC: S1/S2, RRR, no murmurs, rubs, gallops RESPIRATORY:  Clear to auscultation without  rales, wheezing or rhonchi  ABDOMEN: Soft, non-tender, non-distended EXTREMITIES:  No edema; No deformity   ASSESSMENT AND PLAN: .    HFimpEF, NICM Stage C, NYHA class I. EF improved to 55-60%. Euvolemic and well compensated on exam. Continue Coreg, Losartan, and Lasix. Low sodium diet, fluid restriction <2L, and daily weights encouraged. Educated to contact our office for weight gain of 2 lbs overnight or 5 lbs in one week. Will provide refill per his request.   2. Mitral regurgitation, mild mitral valve prolapse Mild MR and mitral valve prolapse noted on TTE. MR has improved from previous studies, previous study 10/2022 showed overall severe MR. This has overall likely improved after quitting cocaine. Denies any symptoms. Will continue to monitor. Plan to update study in next 3-5 years or sooner if clinically indicated.   3. Alcohol abuse Discussed importance of weaning/cutting back on alcohol use. He verbalized understanding.   4. Tobacco use Smoking cessation encouraged and discussed. He verbalized understanding.   Dispo: Will request labs from Xavier Martinez office and provide refill per his request. Follow-up with Xavier Martinez or APP in 6 months or sooner if anything changes.   Signed, Xavier Dory, NP

## 2023-12-20 NOTE — Patient Instructions (Addendum)

## 2023-12-21 ENCOUNTER — Inpatient Hospital Stay: Payer: 59 | Admitting: Oncology

## 2024-01-06 DIAGNOSIS — Z681 Body mass index (BMI) 19 or less, adult: Secondary | ICD-10-CM | POA: Diagnosis not present

## 2024-01-06 DIAGNOSIS — I429 Cardiomyopathy, unspecified: Secondary | ICD-10-CM | POA: Diagnosis not present

## 2024-01-06 DIAGNOSIS — F17210 Nicotine dependence, cigarettes, uncomplicated: Secondary | ICD-10-CM | POA: Diagnosis not present

## 2024-01-22 NOTE — Progress Notes (Deleted)
 Referring Provider: Donetta Potts, MD Primary Care Physician:  Donetta Potts, MD Primary GI Physician: Dr. Bonnetta Barry chief complaint on file.   HPI:   Xavier Martinez is a 73 y.o. male with history of alcohol use, illicit drug use, COPD, nonischemic cardiomyopathy, GERD, chronic hep C since the 80s per patient, acute symptomatic anemia in May 2024 s/p GI evaluation with EGD/colonoscopy/and Given's detailed below with etiology likely secondary to small bowel angiodysplastic lesions in the setting of BCs and aspirin. He is presenting today to discuss starting hepatitis C treatment.  Last seen in the office 12/14/23. He had established with hematology and received IV iron. Hgb was improving. No overt GI bleeding. Still drinking about 3, 12 oz beer per day and last used cocaine a couple weeks prior.  Had no symptoms of decompensated liver disease.  He was scheduled for an ultrasound with elastography and advised to abstain from alcohol and drug use in order to start hep C treatment.  Planned for 6-week follow-up.  Abdominal ultrasound with elastography 12/20/2023 showing a normal-appearing liver.  Median K PA 6.6 though some reduced accuracy in this test.    Today:    Prior endoscopic evaluation: EGD/colonoscopy 04/25/23 at [facility] with normal exams aside from grade 1 hemorrhoids.    Given's 10/06/23: -Single gastric angiectasia. -Few gastric petechiae. -Numerous small bowel angiodysplastic lesions primarily concentrated in the proximal to mid small bowel, but also with a few in the distal small bowel. - IDA and prior melena likely secondary bleeding from small bowel angioectasias in the setting of prior intermittent BC powder and aspirin use (now discontinued).   Past Medical History:  Diagnosis Date   Alcohol use    Anemia    Cocaine use    COPD (chronic obstructive pulmonary disease) (HCC)    GERD (gastroesophageal reflux disease)    Nonischemic  cardiomyopathy (HCC)    Tobacco use     Past Surgical History:  Procedure Laterality Date   GIVENS CAPSULE STUDY N/A 10/06/2023   Procedure: GIVENS CAPSULE STUDY;  Surgeon: Lanelle Bal, DO;  [facility];  Service: Endoscopy;  Laterality: N/A;  730am   MANDIBLE FRACTURE SURGERY Bilateral 2013   TENDON REPAIR Left 08/11/2017   Procedure: TENDON REPAIR WITH GRAFT AND TENDON TRANSFER;  Surgeon: Betha Loa, MD;  Location: [facility];  Service: Orthopedics;  Laterality: Left;   WOUND EXPLORATION Left 08/11/2017   Procedure: LEFT ARM WOUND EXPLORATION;  Surgeon: Betha Loa, MD;  Location: [facility];  Service: Orthopedics;  Laterality: Left;   WRIST ARTHROSCOPY Right 1987    Current Outpatient Medications  Medication Sig Dispense Refill   albuterol (VENTOLIN HFA) 108 (90 Base) MCG/ACT inhaler Inhale 2 puffs into the lungs every 6 (six) hours as needed for wheezing. 6.7 g 0   atorvastatin (LIPITOR) 80 MG tablet Take 1 tablet (80 mg total) by mouth daily. 90 tablet 3   carvedilol (COREG) 3.125 MG tablet Take 1 tablet (3.125 mg total) by mouth 2 (two) times daily with a meal. 180 tablet 3   Cholecalciferol (VITAMIN D3) 50 MCG (2000 UT) capsule Take by mouth.     ferrous sulfate 325 (65 FE) MG tablet Take 1 tablet (325 mg total) by mouth every other day. 30 tablet 3   furosemide (LASIX) 20 MG tablet Take 1 tablet (20 mg total) by mouth daily. 90 tablet 3   losartan (COZAAR) 25 MG tablet Take  1 tablet (25 mg total) by mouth daily. 90 tablet 1   nitroGLYCERIN (NITROSTAT) 0.4 MG SL tablet Place 1 tablet (0.4 mg total) under the tongue every 5 (five) minutes x 3 doses as needed for chest pain (if no relief after 3rd dose proceed to ED or call 911). 25 tablet 1   pantoprazole (PROTONIX) 40 MG tablet TAKE 1 TABLET BY MOUTH EVERY DAY 90 tablet 0   No current facility-administered medications for this visit.    Allergies as of 01/25/2024   (No Known  Allergies)    Family History  Problem Relation Age of Onset   Heart disease Mother     Social History   Socioeconomic History   Marital status: Single    Spouse name: Not on file   Number of children: Not on file   Years of education: Not on file   Highest education level: Not on file  Occupational History   Not on file  Tobacco Use   Smoking status: Every Day    Current packs/day: 0.50    Average packs/day: 0.5 packs/day for 25.0 years (12.5 ttl pk-yrs)    Types: Cigarettes   Smokeless tobacco: Never  Vaping Use   Vaping status: Never Used  Substance and Sexual Activity   Alcohol use: Yes    Alcohol/week: 10.0 standard drinks of alcohol    Types: 10 Cans of beer per week    Comment: 2-3 beer daily.   Drug use: Yes    Types: "Crack" cocaine    Comment: Couple times a month   Sexual activity: Not on file  Other Topics Concern   Not on file  Social History Narrative   Not on file   Social Drivers of Health   Financial Resource Strain: Medium Risk (07/18/2022)   [facility]   Overall Financial Resource Strain (CARDIA)    Difficulty of Paying Living Expenses: Somewhat hard  Food Insecurity: Low Risk  (04/22/2023)   [facility]   Hunger Vital Sign    Worried About Running Out of Food in the Last Year: Never true    Ran Out of Food in the Last Year: Never true  Transportation Needs: Unmet Transportation Needs (04/22/2023)   [facility]   Transportation    In the past 12 months, has lack of reliable transportation kept you from medical appointments, meetings, work or from getting things needed for daily living? : Yes  Physical Activity: Not on file  Stress: No Stress Concern Present (07/18/2022)   Received from [facility], [facility] of Occupational Health - Occupational Stress Questionnaire    Feeling of Stress : Not at all  Social Connections:  Unknown (04/13/2022)   Received from [facility], [facility]   Social Network    Social Network: Not on file    Review of Systems: Gen: Denies fever, chills, anorexia. Denies fatigue, weakness, weight loss.  CV: Denies chest pain, palpitations, syncope, peripheral edema, and claudication. Resp: Denies dyspnea at rest, cough, wheezing, coughing up blood, and pleurisy. GI: Denies vomiting blood, jaundice, and fecal incontinence.   Denies dysphagia or odynophagia. Derm: Denies rash, itching, dry skin Psych: Denies depression, anxiety, memory loss, confusion. No homicidal or suicidal ideation.  Heme: Denies bruising, bleeding, and enlarged lymph nodes.  Physical Exam: There were no vitals taken for this visit. General:   Alert and oriented. No distress noted. Pleasant and cooperative.  Head:  Normocephalic and atraumatic. Eyes:  Conjuctiva clear without scleral icterus. Heart:  S1, S2 present without murmurs appreciated. Lungs:  Clear to auscultation bilaterally. No wheezes, rales, or rhonchi. No distress.  Abdomen:  +BS, soft, non-tender and non-distended. No rebound or guarding. No HSM or masses noted. Msk:  Symmetrical without gross deformities. Normal posture. Extremities:  Without edema. Neurologic:  Alert and  oriented x4 Psych:  Normal mood and affect.    Assessment:     Plan:  ***   Ermalinda Memos, PA-C [facility] 01/25/2024

## 2024-01-25 ENCOUNTER — Ambulatory Visit: Payer: 59 | Admitting: Gastroenterology

## 2024-01-30 ENCOUNTER — Encounter: Payer: Self-pay | Admitting: Gastroenterology

## 2024-02-26 ENCOUNTER — Other Ambulatory Visit: Payer: Self-pay | Admitting: Cardiology

## 2024-03-02 DIAGNOSIS — Z87891 Personal history of nicotine dependence: Secondary | ICD-10-CM | POA: Diagnosis not present

## 2024-03-02 DIAGNOSIS — Z122 Encounter for screening for malignant neoplasm of respiratory organs: Secondary | ICD-10-CM | POA: Diagnosis not present

## 2024-03-02 DIAGNOSIS — I2510 Atherosclerotic heart disease of native coronary artery without angina pectoris: Secondary | ICD-10-CM | POA: Diagnosis not present

## 2024-03-02 DIAGNOSIS — R911 Solitary pulmonary nodule: Secondary | ICD-10-CM | POA: Diagnosis not present

## 2024-06-18 ENCOUNTER — Ambulatory Visit: Payer: 59 | Attending: Nurse Practitioner | Admitting: Nurse Practitioner

## 2024-06-27 DIAGNOSIS — R338 Other retention of urine: Secondary | ICD-10-CM | POA: Diagnosis not present

## 2024-06-27 DIAGNOSIS — Z681 Body mass index (BMI) 19 or less, adult: Secondary | ICD-10-CM | POA: Diagnosis not present

## 2024-07-27 DIAGNOSIS — E039 Hypothyroidism, unspecified: Secondary | ICD-10-CM | POA: Diagnosis not present

## 2024-07-27 DIAGNOSIS — D559 Anemia due to enzyme disorder, unspecified: Secondary | ICD-10-CM | POA: Diagnosis not present

## 2024-07-27 DIAGNOSIS — E782 Mixed hyperlipidemia: Secondary | ICD-10-CM | POA: Diagnosis not present

## 2024-07-27 DIAGNOSIS — Z131 Encounter for screening for diabetes mellitus: Secondary | ICD-10-CM | POA: Diagnosis not present

## 2024-07-27 DIAGNOSIS — Z1389 Encounter for screening for other disorder: Secondary | ICD-10-CM | POA: Diagnosis not present

## 2024-07-27 DIAGNOSIS — E559 Vitamin D deficiency, unspecified: Secondary | ICD-10-CM | POA: Diagnosis not present

## 2024-08-24 ENCOUNTER — Other Ambulatory Visit: Payer: Self-pay | Admitting: Cardiology

## 2024-09-04 ENCOUNTER — Other Ambulatory Visit: Payer: Self-pay | Admitting: Nurse Practitioner

## 2024-09-18 ENCOUNTER — Other Ambulatory Visit: Payer: Self-pay | Admitting: *Deleted

## 2024-09-18 MED ORDER — FERROUS SULFATE 325 (65 FE) MG PO TABS: 325.0000 mg | ORAL_TABLET | ORAL | 3 refills | Status: AC

## 2024-09-18 NOTE — Telephone Encounter (Signed)
 Per chart review, okay to refill

## 2024-11-23 ENCOUNTER — Other Ambulatory Visit: Payer: Self-pay | Admitting: Nurse Practitioner

## 2024-12-04 ENCOUNTER — Other Ambulatory Visit: Payer: Self-pay | Admitting: Nurse Practitioner

## 2024-12-31 ENCOUNTER — Other Ambulatory Visit: Payer: Self-pay | Admitting: Cardiology
